# Patient Record
Sex: Female | Born: 1955 | Race: Black or African American | Hispanic: No | Marital: Married | Smoking: Former smoker
Health system: Southern US, Community
[De-identification: ages and names within clinical notes are randomized; demographics above are authoritative.]

## PROBLEM LIST (undated history)

## (undated) DIAGNOSIS — I82401 Acute embolism and thrombosis of unspecified deep veins of right lower extremity: Secondary | ICD-10-CM

## (undated) DIAGNOSIS — I10 Essential (primary) hypertension: Secondary | ICD-10-CM

## (undated) DIAGNOSIS — N390 Urinary tract infection, site not specified: Secondary | ICD-10-CM

## (undated) DIAGNOSIS — K8020 Calculus of gallbladder without cholecystitis without obstruction: Secondary | ICD-10-CM

## (undated) DIAGNOSIS — F411 Generalized anxiety disorder: Secondary | ICD-10-CM

## (undated) DIAGNOSIS — I2699 Other pulmonary embolism without acute cor pulmonale: Secondary | ICD-10-CM

## (undated) DIAGNOSIS — N200 Calculus of kidney: Secondary | ICD-10-CM

## (undated) DIAGNOSIS — T7840XA Allergy, unspecified, initial encounter: Secondary | ICD-10-CM

## (undated) DIAGNOSIS — J45909 Unspecified asthma, uncomplicated: Secondary | ICD-10-CM

## (undated) DIAGNOSIS — F329 Major depressive disorder, single episode, unspecified: Secondary | ICD-10-CM

## (undated) DIAGNOSIS — R51 Headache: Secondary | ICD-10-CM

## (undated) DIAGNOSIS — R519 Headache, unspecified: Secondary | ICD-10-CM

## (undated) DIAGNOSIS — E785 Hyperlipidemia, unspecified: Secondary | ICD-10-CM

## (undated) HISTORY — DX: Urinary tract infection, site not specified: N39.0

## (undated) HISTORY — PX: ABDOMINAL HYSTERECTOMY: SHX81

## (undated) HISTORY — DX: Calculus of kidney: N20.0

## (undated) HISTORY — DX: Headache: R51

## (undated) HISTORY — DX: Headache, unspecified: R51.9

## (undated) HISTORY — DX: Generalized anxiety disorder: F41.1

## (undated) HISTORY — DX: Allergy, unspecified, initial encounter: T78.40XA

## (undated) HISTORY — DX: Essential (primary) hypertension: I10

## (undated) HISTORY — PX: NASAL SINUS SURGERY: SHX719

## (undated) HISTORY — DX: Unspecified asthma, uncomplicated: J45.909

## (undated) HISTORY — DX: Major depressive disorder, single episode, unspecified: F32.9

## (undated) HISTORY — DX: Other pulmonary embolism without acute cor pulmonale: I26.99

## (undated) HISTORY — PX: COLONOSCOPY: SHX174

## (undated) HISTORY — PX: LAPAROSCOPIC ASSISTED VAGINAL HYSTERECTOMY: SHX5398

## (undated) HISTORY — DX: Acute embolism and thrombosis of unspecified deep veins of right lower extremity: I82.401

## (undated) HISTORY — DX: Calculus of gallbladder without cholecystitis without obstruction: K80.20

## (undated) HISTORY — DX: Hyperlipidemia, unspecified: E78.5

## (undated) HISTORY — PX: TUBAL LIGATION: SHX77

---

## 2003-12-12 ENCOUNTER — Other Ambulatory Visit: Admission: RE | Admit: 2003-12-12 | Discharge: 2003-12-12 | Payer: Self-pay | Admitting: Obstetrics & Gynecology

## 2004-03-14 ENCOUNTER — Ambulatory Visit: Payer: Self-pay | Admitting: Internal Medicine

## 2004-06-23 ENCOUNTER — Ambulatory Visit: Payer: Self-pay | Admitting: Internal Medicine

## 2005-08-03 ENCOUNTER — Emergency Department (HOSPITAL_COMMUNITY): Admission: EM | Admit: 2005-08-03 | Discharge: 2005-08-03 | Payer: Self-pay | Admitting: Emergency Medicine

## 2005-08-05 ENCOUNTER — Ambulatory Visit: Payer: Self-pay | Admitting: Internal Medicine

## 2006-05-17 ENCOUNTER — Ambulatory Visit: Payer: Self-pay | Admitting: Internal Medicine

## 2006-06-02 ENCOUNTER — Ambulatory Visit: Payer: Self-pay | Admitting: Gastroenterology

## 2006-06-08 ENCOUNTER — Ambulatory Visit: Payer: Self-pay | Admitting: Internal Medicine

## 2006-06-22 ENCOUNTER — Ambulatory Visit: Payer: Self-pay | Admitting: Gastroenterology

## 2006-06-25 ENCOUNTER — Inpatient Hospital Stay (HOSPITAL_COMMUNITY): Admission: EM | Admit: 2006-06-25 | Discharge: 2006-06-27 | Payer: Self-pay | Admitting: Emergency Medicine

## 2006-06-30 ENCOUNTER — Ambulatory Visit: Payer: Self-pay | Admitting: Internal Medicine

## 2006-06-30 ENCOUNTER — Ambulatory Visit: Payer: Self-pay | Admitting: Gastroenterology

## 2006-06-30 LAB — CONVERTED CEMR LAB
ALT: 21 units/L (ref 0–40)
AST: 20 units/L (ref 0–37)
Albumin: 3.8 g/dL (ref 3.5–5.2)
Alkaline Phosphatase: 93 units/L (ref 39–117)
Basophils Absolute: 0 10*3/uL (ref 0.0–0.1)
Basophils Relative: 0.2 % (ref 0.0–1.0)
CO2: 28 meq/L (ref 19–32)
Calcium: 8.6 mg/dL (ref 8.4–10.5)
Chloride: 111 meq/L (ref 96–112)
Creatinine, Ser: 0.8 mg/dL (ref 0.4–1.2)
GFR calc Af Amer: 97 mL/min
GFR calc non Af Amer: 80 mL/min
Glucose, Bld: 97 mg/dL (ref 70–99)
Lymphocytes Relative: 50.4 % — ABNORMAL HIGH (ref 12.0–46.0)
MCV: 96.5 fL (ref 78.0–100.0)
Monocytes Relative: 14.3 % — ABNORMAL HIGH (ref 3.0–11.0)
Neutro Abs: 1.2 10*3/uL — ABNORMAL LOW (ref 1.4–7.7)
Neutrophils Relative %: 34 % — ABNORMAL LOW (ref 43.0–77.0)
Potassium: 4.1 meq/L (ref 3.5–5.1)
Sodium: 143 meq/L (ref 135–145)

## 2006-07-01 ENCOUNTER — Ambulatory Visit (HOSPITAL_COMMUNITY): Admission: RE | Admit: 2006-07-01 | Discharge: 2006-07-01 | Payer: Self-pay | Admitting: Gastroenterology

## 2006-08-27 ENCOUNTER — Encounter (INDEPENDENT_AMBULATORY_CARE_PROVIDER_SITE_OTHER): Payer: Self-pay | Admitting: Obstetrics and Gynecology

## 2006-08-28 ENCOUNTER — Inpatient Hospital Stay (HOSPITAL_COMMUNITY): Admission: RE | Admit: 2006-08-28 | Discharge: 2006-08-29 | Payer: Self-pay | Admitting: Obstetrics and Gynecology

## 2006-11-08 DIAGNOSIS — K8010 Calculus of gallbladder with chronic cholecystitis without obstruction: Secondary | ICD-10-CM

## 2006-11-08 DIAGNOSIS — R519 Headache, unspecified: Secondary | ICD-10-CM | POA: Insufficient documentation

## 2006-11-08 DIAGNOSIS — K8020 Calculus of gallbladder without cholecystitis without obstruction: Secondary | ICD-10-CM

## 2006-11-08 DIAGNOSIS — R51 Headache: Secondary | ICD-10-CM | POA: Insufficient documentation

## 2006-11-08 DIAGNOSIS — L509 Urticaria, unspecified: Secondary | ICD-10-CM | POA: Insufficient documentation

## 2006-11-08 DIAGNOSIS — I10 Essential (primary) hypertension: Secondary | ICD-10-CM | POA: Insufficient documentation

## 2006-11-08 HISTORY — DX: Essential (primary) hypertension: I10

## 2006-11-08 HISTORY — DX: Calculus of gallbladder without cholecystitis without obstruction: K80.20

## 2006-11-08 HISTORY — DX: Calculus of gallbladder with chronic cholecystitis without obstruction: K80.10

## 2006-11-11 ENCOUNTER — Encounter: Payer: Self-pay | Admitting: Internal Medicine

## 2006-11-11 DIAGNOSIS — F411 Generalized anxiety disorder: Secondary | ICD-10-CM | POA: Insufficient documentation

## 2006-11-11 DIAGNOSIS — F32A Depression, unspecified: Secondary | ICD-10-CM | POA: Insufficient documentation

## 2006-11-11 DIAGNOSIS — F329 Major depressive disorder, single episode, unspecified: Secondary | ICD-10-CM

## 2006-11-11 DIAGNOSIS — F3289 Other specified depressive episodes: Secondary | ICD-10-CM

## 2006-11-11 HISTORY — DX: Other specified depressive episodes: F32.89

## 2006-11-11 HISTORY — DX: Generalized anxiety disorder: F41.1

## 2006-11-11 HISTORY — DX: Major depressive disorder, single episode, unspecified: F32.9

## 2007-10-28 IMAGING — CT CT ABDOMEN W/ CM
2 of 6 series · 17 of 46 positions shown, 19 images · IV contrast (Omnipaque 300)
Comparison: 06/24/06.

07/02/06 - DUPLICATE COPY for exam association in [facility] ? No change from original report.
CLINICAL DATA: Right lower quadrant pain. Severe abdominal pain since colonoscopy 06/22/06. Nausea and blood in stool.
 ABDOMEN CT WITH CONTRAST:
TECHNIQUE: Multidetector CT imaging of the abdomen was performed following the standard protocol during bolus administration of intravenous contrast.
 Contrast:  125 cc Omnipaque 300.
TECHNIQUE: Multidetector CT imaging of the pelvis was performed following the standard protocol during bolus administration of intravenous contrast.

[Series 2: abd_pel 5.0 b30f st · axial · 0.70mm/px · z∈[+938,+1298]mm · 14 of 80 slices shown, 16 images]
[im 4/80  soft-tissue]
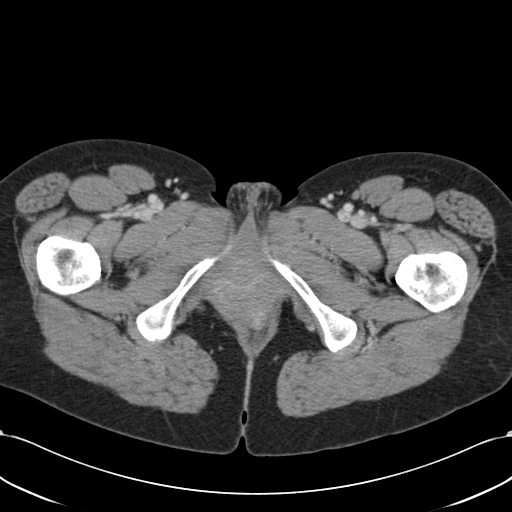
[im 4/80  bone]
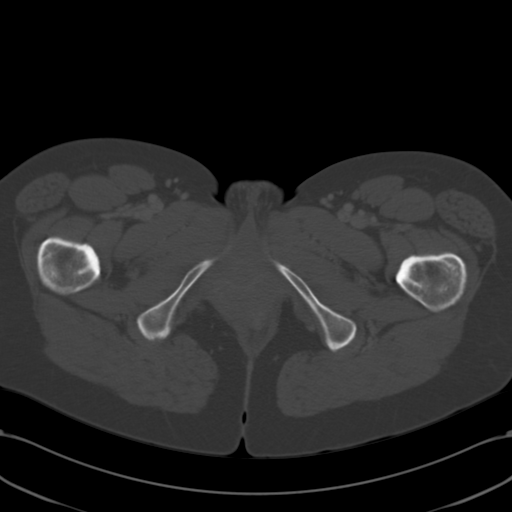
[im 12/80  soft-tissue]
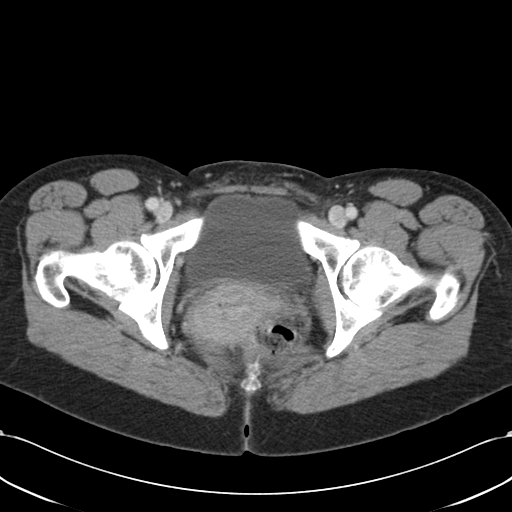
[im 16/80  soft-tissue]
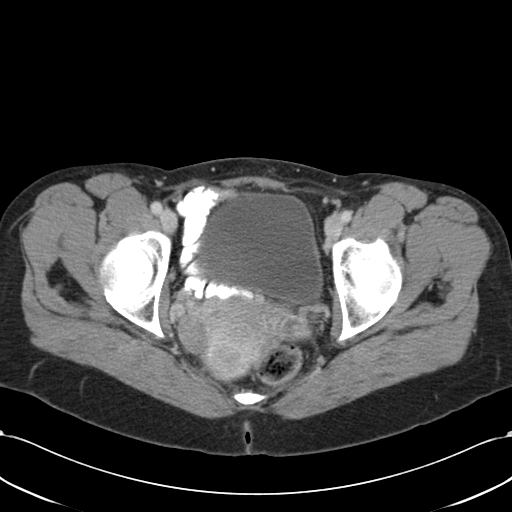
[im 20/80  soft-tissue]
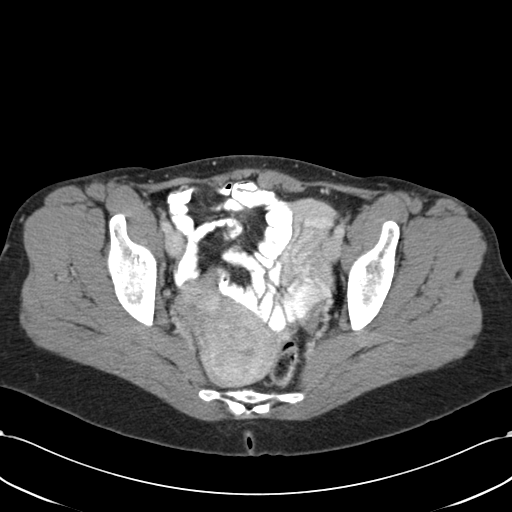
[im 28/80  soft-tissue]
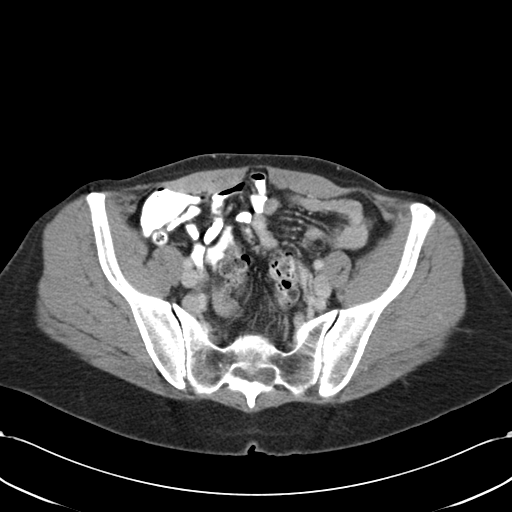
[im 32/80  soft-tissue]
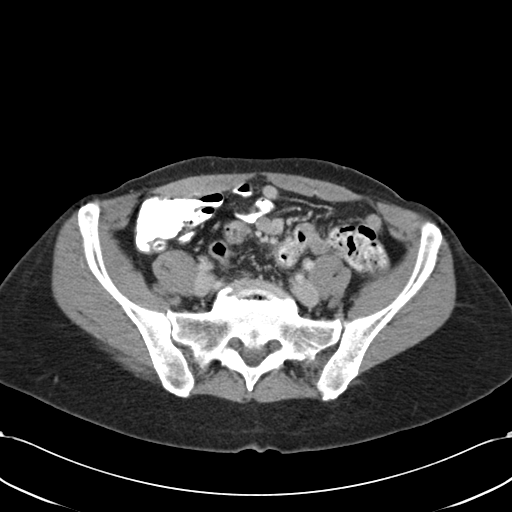
[im 36/80  soft-tissue]
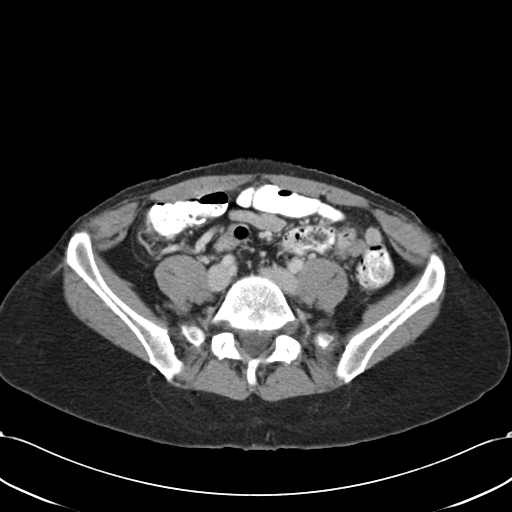
[im 44/80  soft-tissue]
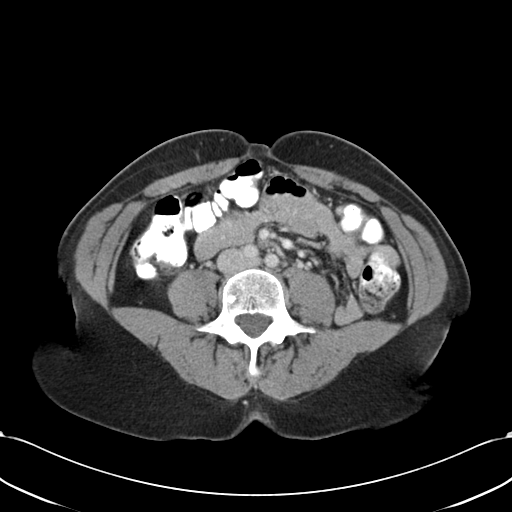
[im 48/80  soft-tissue]
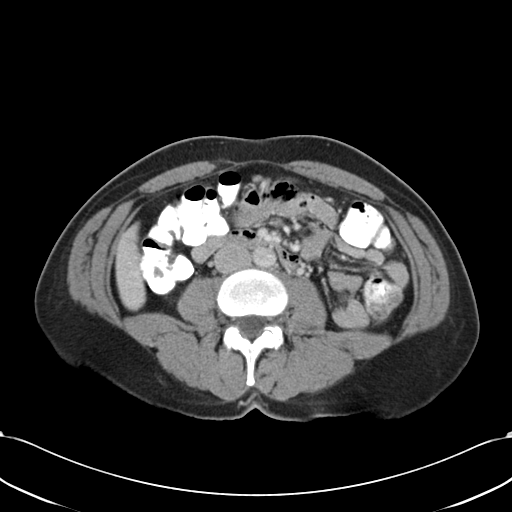
[im 48/80  bone]
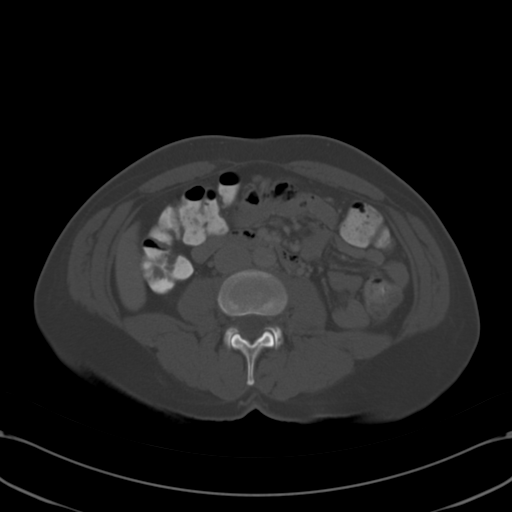
[im 52/80  soft-tissue]
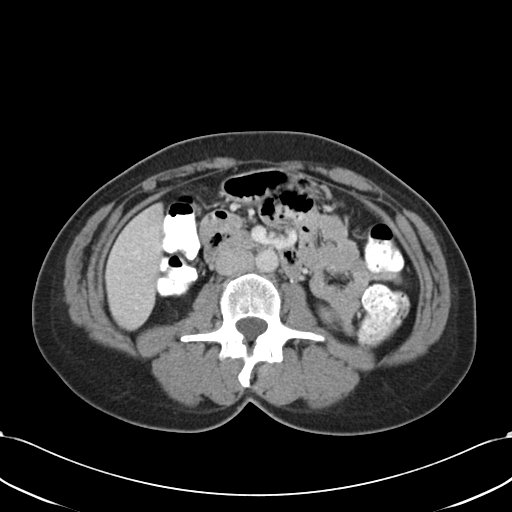
[im 60/80  soft-tissue]
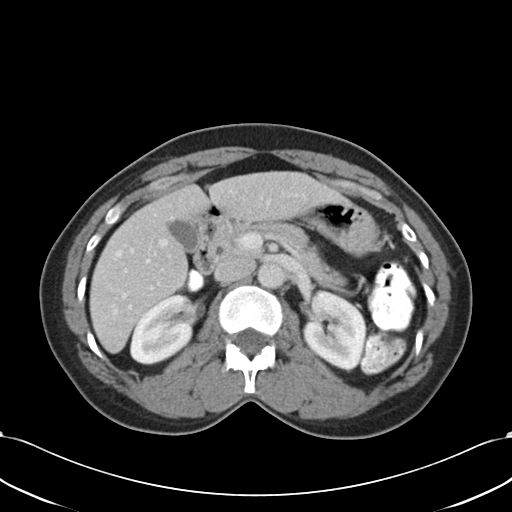
[im 64/80  soft-tissue]
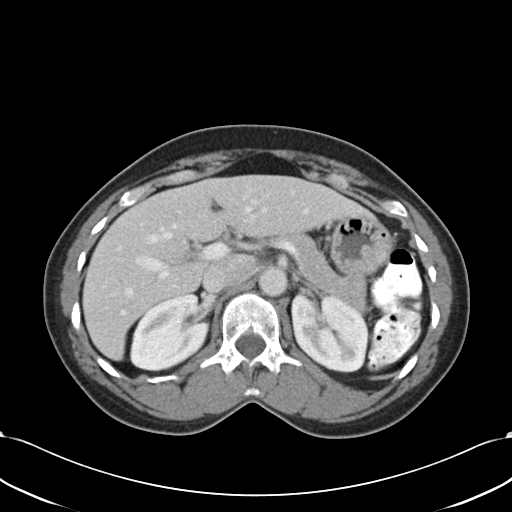
[im 68/80  soft-tissue]
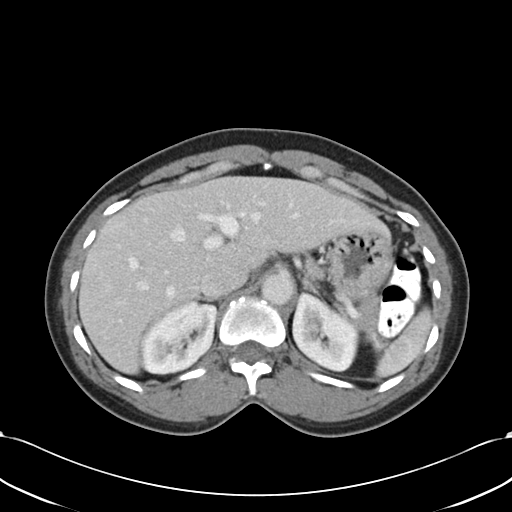
[im 76/80  soft-tissue]
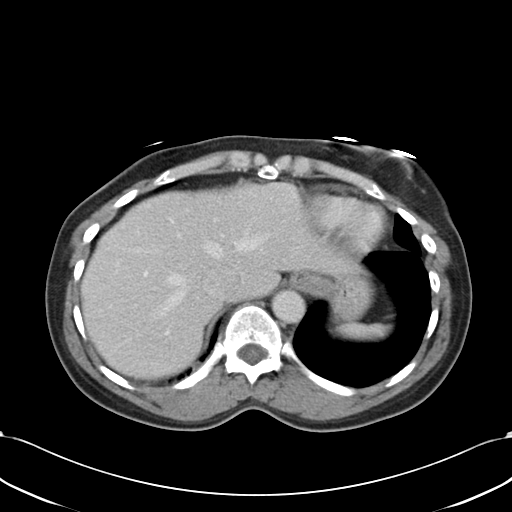

[Series 602: <mpr thick range> · coronal · 0.81mm/px · 3 of 84 slices shown]
[im 28/84  soft-tissue]
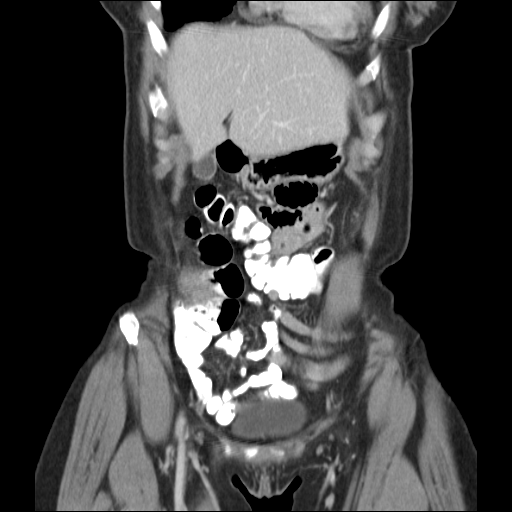
[im 37/84  soft-tissue]
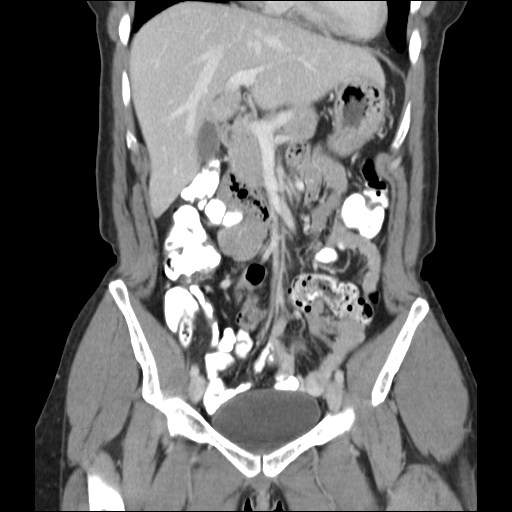
[im 47/84  soft-tissue]
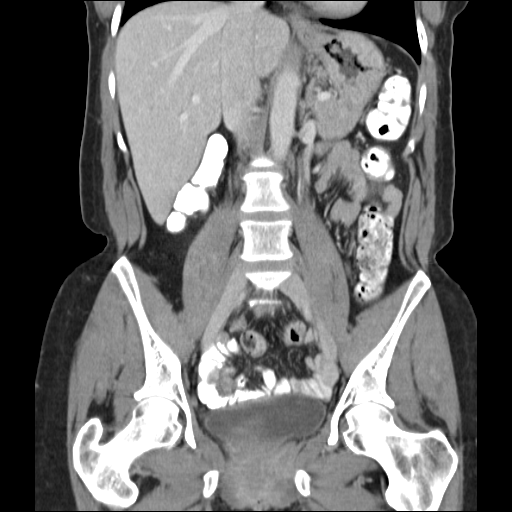

[17 of 46 positions shown; findings below may reference images not displayed]

FINDINGS: A small hemangioma is seen in the right hepatic lobe. The liver, gallbladder, adrenal glands, kidneys, spleen, pancreas, stomach, and small bowel are otherwise unremarkable. No pathologically enlarged lymph nodes. No free fluid.
IMPRESSION: No acute findings. 
 PELVIS CT WITH CONTRAST:
FINDINGS: Fibroid uterus. No pathologically enlarged lymph nodes.  No free fluid.  The colon and appendix are unremarkable. No worrisome lytic or sclerotic lesion.
IMPRESSION: Enlarged fibroid uterus.

## 2007-11-17 LAB — CONVERTED CEMR LAB: Pap Smear: NORMAL

## 2008-01-07 ENCOUNTER — Emergency Department (HOSPITAL_COMMUNITY): Admission: EM | Admit: 2008-01-07 | Discharge: 2008-01-08 | Payer: Self-pay | Admitting: Emergency Medicine

## 2008-01-09 ENCOUNTER — Encounter: Admission: RE | Admit: 2008-01-09 | Discharge: 2008-01-09 | Payer: Self-pay | Admitting: Orthopedic Surgery

## 2008-04-16 ENCOUNTER — Ambulatory Visit: Payer: Self-pay | Admitting: Internal Medicine

## 2008-04-16 DIAGNOSIS — G4710 Hypersomnia, unspecified: Secondary | ICD-10-CM | POA: Insufficient documentation

## 2008-04-16 LAB — CONVERTED CEMR LAB
AST: 27 units/L (ref 0–37)
BUN: 10 mg/dL (ref 6–23)
CO2: 27 meq/L (ref 19–32)
Calcium: 9.2 mg/dL (ref 8.4–10.5)
Creatinine, Ser: 0.7 mg/dL (ref 0.4–1.2)
Eosinophils Absolute: 0 10*3/uL (ref 0.0–0.7)
GFR calc Af Amer: 113 mL/min
Glucose, Bld: 94 mg/dL (ref 70–99)
HCT: 38.4 % (ref 36.0–46.0)
HDL: 52.2 mg/dL (ref >39.0)
Hemoglobin: 13.1 g/dL (ref 12.0–15.0)
Ketones, ur: NEGATIVE mg/dL
MCV: 95.1 fL (ref 78.0–100.0)
Monocytes Absolute: 0.2 10*3/uL (ref 0.1–1.0)
Monocytes Relative: 4.8 % (ref 3.0–12.0)
Neutro Abs: 2 10*3/uL (ref 1.4–7.7)
Nitrite: NEGATIVE
Platelets: 175 10*3/uL (ref 150–400)
Specific Gravity, Urine: <=1.005 (ref 1.000–1.035)
Total Protein, Urine: NEGATIVE mg/dL
Total Protein: 7.2 g/dL (ref 6.0–8.3)
Triglycerides: 63 mg/dL (ref 0–149)
Urobilinogen, UA: 0.2 (ref 0.0–1.0)
WBC: 4.5 10*3/uL (ref 4.5–10.5)

## 2008-05-02 ENCOUNTER — Ambulatory Visit: Payer: Self-pay | Admitting: Pulmonary Disease

## 2010-07-01 NOTE — Consult Note (Signed)
NAME:  Deborah Pratt, Deborah Pratt     ACCOUNT NO.:  [id number]   MEDICAL RECORD NO.:  [id number]          PATIENT TYPE:  OBV   LOCATION:  1614                         FACILITY:  [facility]   PHYSICIAN:  Velora Heckler, MD      DATE OF BIRTH:  04-28-1955   DATE OF CONSULTATION:  06/25/2006  DATE OF DISCHARGE:                                 CONSULTATION   GENERAL SURGERY CONSULTATION   REFERRING PHYSICIAN:  Mike Gip, PA-C  for [facility].   CHIEF COMPLAINT:  Abdominal pain.   HISTORY OF PRESENT ILLNESS:  Deborah Pratt is a 55 year old  black female admitted on May8, 2008, for abdominal pain following  colonoscopy.  The patient had undergone a routine screening colonoscopy  on May6, 2008, by Dr. Rob Bunting of [facility].  Procedure was uncomplicated.  Findings were normal exam.  No polypectomy  or biopsies were taken.  Approximately 36-48 hours following the  procedure, the patient developed suprapubic abdominal pain.  She  describes this as sharp in nature.  She describes it as radiating into  the chest.  She denies nausea or vomiting.  She denies fevers.  She has  not had a normal bowel movement since the procedure.  She denies  bleeding per rectum.  She is passing flatus.  The patient presented to  the emergency department.  She was admitted onto the gastroenterology  service.  Laboratory studies showed normal white blood count.  The  patient underwent CT scan abdomen and pelvis.  Findings included a large  amount of stool in the colon.  There appears to be some stool at the  base of the appendix with slight thickening of the base of the appendix.  There appears to be air in the appendiceal lumen.  There is no sign of  inflammation.  There is no sign of perforation.  General surgery was  consulted at this time for evaluation for possible subacute  appendicitis.   PAST MEDICAL HISTORY:  Status post tubal ligation.   MEDICATIONS:  Ibuprofen  p.r.n.   ALLERGIES:  1. ASPIRIN.  2. TYLENOL.   SOCIAL HISTORY:  The patient is married.  She lives in [place].  She  smokes cigarettes.  She denies alcohol use.   FAMILY HISTORY:  Noncontributory.   REVIEW OF SYSTEMS:  A 15-system review documented in medical record  without significant other findings except for asthma and  nephrolithiasis.   PHYSICAL EXAMINATION:  GENERAL:  A 55 year old well-developed, well-  nourished black female in no acute distress.  No complaints.  VITAL SIGNS:  Temperature 97.5, pulse 68, respirations 18, blood  pressure 95/50, O2 saturation 99% room air.  HEENT:  Shows her be normocephalic, atraumatic.  There is a conjunctival  hemorrhage in the left eye.  NECK:  Soft, nontender without mass or adenopathy.  CHEST:  Clear to auscultation bilaterally.  CARDIAC:  Shows regular rate and rhythm without murmur.  Peripheral  pulses are full.  ABDOMEN:  Soft, scaphoid.  Bowel sounds are present on auscultation.  The patient notes passage of flatus.  There is mild tenderness to  percussion and palpation in the suprapubic region.  The  remainder of the  abdomen is soft.  There is no guarding.  There is no rebound.  Specifically, in the right lower quadrant there is a relatively normal  exam without mass.  There is no sign of hernia.  EXTREMITIES:  Show no  edema.  NEUROLOGICALLY:  The patient is alert and oriented.  There is no  evidence of tremor.  There are no gross neurologic deficits.   LABORATORY STUDIES:  White count 3.9, hemoglobin 10.5, platelet count  171,000.  Differential shows an abnormally elevated lymphocyte count of  61% and a low segmented neutrophil count of 24%.  Electrolytes were  normal.  Urinalysis is benign.   RADIOGRAPHY:  CT scan, abdomen and pelvis, reviewed with Dr. Irish Lack, with findings as noted above.   IMPRESSION:  Abdominal pain of undetermined source following  colonoscopy.   RECOMMENDATIONS:  1. No evidence of  acute appendicitis.  2. No evidence of perforation or cecal tear.  3. Chronic constipation.  4. Allow clear liquid diet.  5. Repeat CBC with differential in a.m. May10, 2008, to follow up      on lymphocytosis.  6. Will follow closely with you.   I discussed with the patient all of the above findings.  I explained to  her that she did not require surgical intervention at this time and  hopefully would not require an operation during this hospitalization.  I  did caution her that we would recheck her laboratory work and reexamine  her abdomen.  We will allow her to begin taking clear liquids.      Velora Heckler, MD  Electronically Signed     TMG/MEDQ  D:  06/25/2006  T:  06/25/2006  Job:  [id number]   cc:   Mike Gip, PA-C  [address] [place], [place] [address]   Rachael Fee, MD  [address]  [place], [place] [address]

## 2010-07-01 NOTE — H&P (Signed)
NAME:  Deborah Pratt, Deborah Pratt     ACCOUNT NO.:  [id number]   MEDICAL RECORD NO.:  [id number]          PATIENT TYPE:  OBV   LOCATION:  1614                         FACILITY:  [facility]   PHYSICIAN:  Deborah Morton. Juanda Chance, MD     DATE OF BIRTH:  1955/05/15   DATE OF ADMISSION:  06/24/2006  DATE OF DISCHARGE:                              HISTORY & PHYSICAL   CHIEF COMPLAINT:  Acute abdominal pain.   HISTORY:  Deborah Pratt is a pleasant, generally healthy, African American  female who presents with a complaint of abdominal pain. The patient is  known to Dr. Artist Pratt.  She had undergone screening colonoscopy with Dr.  Christella Pratt, done on Jun 22, 2006, at the [facility].  This was a normal exam.  She  did not have any polyps nor any diverticuli noted.  The patient  tolerated the procedure well and says that she did not have any  abdominal pain the evening after her colonoscopy.  The following day she  felt okay until the evening of Jun 23, 2006, when she began with left  lower quadrant discomfort.  She said she had been able to eat without  much difficulty, did not have any nausea, vomiting, or fever or chills,  had gone to bed, and was able to sleep.  However, on awakening the pain  was still present.  She went on to work and later in the day went to the  bathroom to urinate and says that she passed a huge amount of urine and  then experienced fairly severe crampy lower abdominal pain which doubled  her over.  This has persisted with pain across her lower abdomen.  She  denies any dysuria, hematuria, etc.  She had a normal bowel movement  earlier in the day.  Because of the persistent pain and pain with  walking, she presents to the emergency room.  She is found to be hemodynamically stable, multi abs show no evidence of  free air.  Labs with a WBC of 3.9, hemoglobin 11.6, hematocrit of 34.2,  potassium 3.3.  Liver function studies normal.  UA is pending.  She is  admitted for observation for pain control and further  diagnostic workup  with CT of the abdomen and pelvis.   PAST HISTORY:  Pertinent for:  1. Asthma.  2. Bilateral tubal ligation.  3. Ureterolithiasis.   FAMILY HISTORY:  Negative for GI   SOCIAL HISTORY:  The patient is a smoker.  No EtOH.  She is married.   REVIEW OF SYSTEMS:  CARDIOVASCULAR:  Denies any chest pain or anginal  symptoms.  PULMONARY:  Negative for cough, shortness of breath, or  sputum production.  GENITOURINARY:  As above.  She does have a history  of ureterolithiasis 30 years ago that has not recurred since.  MUSCULOSKELETAL:  Negative.  GI:  As outlined above.  All other review  of systems negative.   PHYSICAL EXAMINATION:  GENERAL:  A well-developed African American  female in no acute distress.  Alert and oriented x3 97.  VITAL SIGNS:  Temperature 97.5, BP 141/90, pulse of 88, respirations 18.  HEENT:  Nontraumatic, normocephalic.  EOMI, PERRLA.  Sclerae anicteric.  NECK:  Supple without nodes.  CARDIOVASCULAR:  Regular rate and rhythm with S1 and S2.  No murmur or  gallop.  PULMONARY:  Clear to A&P.  ABDOMEN:  Soft.  Bowel sounds are active.  She is tender in the right,  greater than left, lower quadrant and has rebound in no bilateral lower  quadrants.  There is no guarding.  No mass or hepatosplenomegaly.  RECTAL:  Not done.  EXTREMITIES:  Without clubbing, cyanosis or edema.  NEUROLOGIC:  Grossly nonfocal.   IMPRESSION:  A 55 year old African American female with acute severe  abdominal pain, lower abdomen, 48 hours post colonoscopy.  No evidence  for perforation on plain films.  Rule out micro perforation versus  spasm, rule ureterolithiasis, rule out ovarian cyst rupture,  rule out  possible appendicitis.   PLAN:  Patient is admitted to observation for pain control, antiemetics,  UA and urine culture, CT of the abdomen and pelvis, and further plans  pending findings on CT.      Deborah Madrid, PA-C      Deborah M. Juanda Chance, MD  Electronically  Signed    AE/MEDQ  D:  06/25/2006  T:  06/25/2006  Job:  [id number]

## 2010-07-01 NOTE — Assessment & Plan Note (Signed)
[facility]                                 ON-CALL NOTE   NAME:WHITNEY-TAYLORJaynee, Deborah Pratt              MRN:          [id number]  DATE:06/23/2006                            DOB:          26-Aug-1955    TELEPHONE NUMBER:  [phone]   CALLER:  Patient.   TIME OF CALL:  8:19pm.   [name] called this evening complaining of lower abdominal  tightness that developed this afternoon. She reports to me that she was  referred to Dr. Christella Hartigan for colonoscopy because of problems with  constipation. She tells me that the examination was negative with no  intervention such as polypectomy. She tells me that she did well last  evening and again was well this morning. She tells me that she was  contacted by her endoscopy nurse for routine follow up and had no  complaints. She has been eating. There is no nausea, vomiting, or  fevers. She has had a small bowel movement. No bleeding. She denies  urinary complaints. She denies having had this type of tightness before.  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 I told her that I thought it would be  unlikely that she would have done well for a day post procedure than  develop problems related to the procedure. However, I told her that it  was certainly possible. As well it, could be some other unrelated  process. I told her that I could not diagnose or treat her problem over  the telephone. As such, since she described as significant discomfort, I  recommended that she go to the emergency room for evaluation. She was  agreeable.     Wilhemina Bonito. Marina Goodell, MD  Electronically Signed    JNP/MedQ  DD: 06/23/2006  DT: 06/24/2006  Job #: [id number]   cc:   Rachael Fee, MD

## 2010-07-01 NOTE — H&P (Signed)
Deborah Pratt, Deborah Pratt     ACCOUNT NO.:  [id number]   MEDICAL RECORD NO.:  [id number]          PATIENT TYPE:  AMB   LOCATION:  SDC                           FACILITY:  [facility]   PHYSICIAN:  Janine Limbo, M.D.DATE OF BIRTH:  02-23-1955   DATE OF ADMISSION:  08/27/2006  DATE OF DISCHARGE:                              HISTORY & PHYSICAL   HISTORY OF PRESENT ILLNESS:  The patient is a 55 year old female, para  II-0-I-II, who presents for laparoscopy assisted vaginal hysterectomy.  The patient has been followed at the [facility] and  [facility] of [facility].  The patient  complains of pelvic pain, fibroids, menorrhagia, and dyspareunia.  Evaluation has included an ultrasound which showed an 8.8- x 6.5- x for  4.8-cm uterus.  Several fibroids were noted with the largest measuring  5.5 cm x 2.9 cm x 2.1 cm.  The ovaries appeared normal except for a  dominant follicle which measured 1.2 cm.  The patient reports that the  majority of her discomfort began in May 2008.  The patient had a  colonoscopy on Jun 22, 2006.  The findings were normal.  However, shortly  thereafter she began having severe abdominal pain and was admitted to  the hospital for observation and for pain management.  A CT scan was  performed, and it was thought to be within normal limits except for the  fibroids noted above.  Multiple laboratory tests were performed that  were all normal.  Cultures of her cervix were noted to be normal.  The  patient has seen a urologist, and he could find no urological basis for  her discomfort.  He did think that she perhaps had bladder spasms, and  this was slightly improved using Vesicare.  She complains of urinary  urgency and urinary frequency.  She also complains of constipation and  occasional nausea.  She reports having severe dyspareunia.  The patient  reports that her discomfort is worse when she ambulates.  Pain  medications  do not relieve the discomfort, they just make her sleepy.  The patient has been treated with antibiotics, and her discomfort has  not completely resolved.  She did have an endometrial biopsy performed  that showed benign endometrial tissue.  The patient has had a tubal  ligation dating back to 17.  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   OBSTETRICAL HISTORY:  The patient has had two term vaginal deliveries  and one elective pregnancy termination.   DRUG ALLERGIES:  The patient's drug allergy history is very confusing.  SHE REPORTS THAT IBUPROFEN, TYLENOL, AND ASPIRIN CAUSE HER TO HAVE  HIVES.  SHE REPORTS THAT MORPHINE CAUSES HER TO HAVE A RASH.  She says  that she has been able to take Demerol in the past, and that relieves  her discomfort.  She also reports that she is able to take Darvocet, and  that causes no problems for her, and it relieves her discomfort.  I did  point out that Darvocet has acetaminophen in it.  THE PATIENT REPORTS  HAVING MULTIPLE FOOD  ALLERGIES.   PAST MEDICAL HISTORY:  The patient has told some health care providers  that she had a history of asthma and a history of kidney stones.  Other  health care providers she does not report that history.   CURRENT MEDICATIONS:  Pain medicines and antibiotics (which she will  complete prior to her surgery).   SOCIAL HISTORY:  The patient stopped smoking two years ago.  She denies  alcohol use and recreational drug use.  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   REVIEW OF SYSTEMS:  Please see history of present illness.   FAMILY HISTORY:  The patient's mother has heart disease.  Her father had  a stroke.   PHYSICAL EXAM:  VITAL SIGNS:  Weight is 163 pounds.  Height is 5 feet 9  inches.  HEENT:  Within normal limits except for a patient that appears  uncomfortable as you discuss her problems.  CHEST:   Clear.  HEART:  Regular rate and rhythm.  BREASTS:  Without masses.  BACK:  Shows no CVA tenderness; though, she is noted to have tight back  muscles.  ABDOMEN:  Tender in the lower quadrants; although, her bowel sounds are  normal.  There is voluntary guarding but no rebound.  EXTREMITIES:  Grossly normal.  NEUROLOGIC:  Grossly normal.  PELVIC:  External genitalia is normal.  The vagina appears normal;  although, the bladder does seem to be tender on exam.  The cervix is  tender to motion.  The uterus is eight weeks size and tender.  Adnexa:  No masses are appreciated.  Rectovaginal exam confirms.   ASSESSMENT:  1. Fibroid uterus.  2. Menorrhagia.  3. Dyspareunia.  4. Pelvic pain.  5. Questionable bladder spasms.  6. Constipation.  7. Multiple allergies to medications.   PLAN:  The patient will undergo a laparoscopy assisted vaginal  hysterectomy.  She understands the indications for her surgical  procedure, and she accepts the risks of, but not limited to, anesthetic  complications, bleeding, infection, and possible damage to the  surrounding organs.  She understands that no guarantees can be given  concerning the relief of her discomfort.      Janine Limbo, M.D.  Electronically Signed     AVS/MEDQ  D:  08/20/2006  T:  08/20/2006  Job:  [id number]   cc:   Corwin Levins, MD  [address]  [place]  [place] [address]   Rachael Fee, MD  [address]  [place], [place] [address]   Hedwig Morton. Juanda Chance, MD  [address]  [place]  [place] [address]   Bertram Millard. Dahlstedt, M.D.  Fax: [phone]

## 2010-07-01 NOTE — Assessment & Plan Note (Signed)
[facility]                         GASTROENTEROLOGY OFFICE NOTE   NAME:WHITNEY-TAYLOR, ANITHA KREISER            MRN:          [id number]  DATE:06/30/2006                            DOB:          11-Aug-1955    GI FOLLOWUP NOTE   GI PROBLEM LIST:  Localized right lower quadrant pain since colonoscopy  Jun 22, 2006.  Colonoscopy was uneventful.  No polyps were removed.  Pain  began approximately 24 hours after the procedure.  CT scan showed some  air and debris and a somewhat dilated appendix, but no sign of  inflammation or perforation.  Dr. Gerrit Friends from [facility] consulted while the  patient was hospitalized for 2 to 3 days.  She was sent home this past  weekend.   INTERVAL HISTORY:  Ms. Ladona Ridgel is still quite uncomfortable in her right  lower quadrant.  She said it has not gotten better and possibly has  gotten a bit worse.  She has had no fevers, but has had some chills at  home.  She says the pain is constant and is not related to voiding her  bladder now.   CURRENT MEDICATIONS:  Allegra.  Zyrtec.  Xifaxan.   PHYSICAL EXAM:  Afebrile.  Weight 157 pounds, blood pressure 130/82,  pulse 60.  CONSTITUTIONAL:  Uncomfortable-appearing.  LUNGS:  Clear to auscultation bilaterally.  HEART:  Regular rate and rhythm.  ABDOMEN:  Soft, nondistended.  Focally tender in the right lower  quadrant with rebound tenderness, positive Rovsing sign.  No diffuse  peritoneal signs though.   ASSESSMENT AND PLAN:  A 55 year old woman with pain, abnormal appendix  following routine screening colonoscopy.   Ms. Ladona Ridgel has been having increasing right lower quadrant discomfort  since an uneventful colonoscopy last week.  CT scan did show an abnormal  appendix, but no sign of inflammation.  I am suspicious that she is  developing appendicitis.  I will arrange for her to have a repeat CT  scan done today, as well as blood work.  I will contact Dr. Gerrit Friends, who  met her while she  was hospitalized, and discuss the case with him.  I  think, unfortunately, that even if the scan is normal with her ongoing  focal right lower quadrant pain, she may need laparoscopy.     Rachael Fee, MD  Electronically Signed    DPJ/MedQ  DD: 06/30/2006  DT: 06/30/2006  Job #: [id number]   cc:   Velora Heckler, MD  Barbette Hair. Artist Pais, DO

## 2010-07-01 NOTE — Assessment & Plan Note (Signed)
[facility]                                 ON-CALL NOTE   NAME:Deborah Pratt, [place]              MRN:          [id number]  DATE:06/30/2006                            DOB:          1956/01/01    TELEPHONE NUMBER:  [phone].   PRIMARY CARE PHYSICIAN:  Rob Bunting, M.D.   TIME:  8:10 p.m.   CALL NOTE:  Deborah Pratt calls tonight, wanting the results of a CT  scan that was performed today and was very irritated, frustrated, and  irate, that she had not received a phone call back Dr. Christella Hartigan on the  report of the CT scan. I conveyed to her that on a normal or a minimally  abnormal scan, we will not get a report until the next day she became  even more irritated. I tried to obtain more history and apparently, she  had been hospitalized a few days ago for lower abdominal pain following  a colonoscopy and a CT scan was performed in the hospital as well as  blood work that was not revealing as to a cause of her pain. She is  still having what she describes as pelvic pain without vomiting,  fevers, chills, or gastrointestinal bleeding. I told her that Dr. Christella Hartigan  or his nurse should be in touch with her tomorrow on the results of her  CT scan and if she did not receive a call by the midafternoon she should  call the office. If she felt worse tonight, she was advised go to the  emergency room. After I attempted to get more history from her and  reassure her, an man who was with her took the phone and began yelling  at me that we had not gotten results to her appropriately and that I  just needed to pass on a message that she wanted the CT report and she  did not need to give me any more information about her situation. He was  loud, inappropriate, aggressive, and then he began using profanity, at  which time I told him that I was unable to speak to him anymore and was  hanging up the phone.     Venita Lick. Russella Dar, MD, [facility]  Electronically  Signed    MTS/MedQ  DD: 06/30/2006  DT: 06/30/2006  Job #: [id number]   cc:   Rachael Fee, MD

## 2010-07-01 NOTE — Op Note (Signed)
NAMEANAHLA, BEVIS     ACCOUNT NO.:  [id number]   MEDICAL RECORD NO.:  [id number]          PATIENT TYPE:  OIB   LOCATION:  9311                          FACILITY:  [facility]   PHYSICIAN:  Janine Limbo, M.D.DATE OF BIRTH:  August 26, 1955   DATE OF PROCEDURE:  08/27/2006  DATE OF DISCHARGE:                               OPERATIVE REPORT   PREOPERATIVE DIAGNOSIS:  1. Fibroid uterus.  2. Pelvic pain.  3. Dysmenorrhea.  4. Dyspareunia.   POSTOPERATIVE DIAGNOSIS:  1. Fibroid uterus.  2. Pelvic pain.  3. Dysmenorrhea.  4. Dyspareunia.  5. Pelvic adhesions.   PROCEDURE:  Laparoscopic assisted vaginal hysterectomy.   SURGEON:  Janine Limbo, M.D.   FIRST ASSISTANT:  Hal Morales, M.D.   ANESTHESIA:  General.   DISPOSITION:  Ms. Pollio is a 55 year old female, para 2-0-1-2,  who presents with the above mentioned diagnosis.  She understands the  indications for the surgical procedure and she accepts the risks of, but  not limited to, anesthetic complications, bleeding, infection, and  possible damage to surrounding organs.  She understands that no  guarantees can be given concerning the total relief of her discomfort.   FINDINGS:  The uterus was 10 weeks' size and it contained multiple  subserosal fibroids.  The right ovary was normal.  The left ovary was  normal except it did contain a 1-2 cm simple cyst.  The fallopian tubes  were normal except for defects from her prior tubal ligation.  There  were mild to moderate adhesions between the right fallopian tube and the  right posterior uterus.  The appendix was shortened and it was scarred  to the bowel.  The anterior cul-de-sac was normal.  The posterior cul-de-  sac was normal.  The upper abdomen appeared normal.   PROCEDURE IN DETAIL:  The patient was taken to the operating room where  a general anesthetic was given.  The patient's abdomen, perineum, and  vagina were prepped with multiple layers of  Hibiclens.  Examination  under anesthesia was performed.  A catheter was placed in the bladder.  A Hulka tenaculum was placed inside the uterus.  The patient was then  sterilely draped.  The subumbilical area was injected with 5 mL of 0.5%  Marcaine with epinephrine.  An incision was made and the Veress needle  was inserted without difficulty.  Proper placement was confirmed using  the saline drop test.  A pneumoperitoneum was then obtained.  The  laparoscopic trocar and then the laparoscope were substituted for the  Veress needle.  Care was taken not to damage the bowel.  The pelvis was  carefully inspected.  Two suprapubic areas were injected with a total of  5 mL of 0.5% Marcaine with epinephrine and two 5 mm trocars were placed  in the lower abdomen under direct visualization.  Pictures were taken of  the patient's pelvic anatomy.   The ureters were identified and were noted to be away from our surgical  areas.  The right round ligament was identified, cauterized, and cut.  The right utero-ovarian ligament and right fallopian tube were  cauterized and cut.  The uterine artery  was skeletonized on the right.  The bladder flap was developed using sharp dissection and  hydrodissection.  An identical procedure was carried out on the opposite  side.   At this point, we felt that we were ready to proceed with the vaginal  portion of the procedure.  The patient was placed in a more lithotomy  position.  A weighted speculum was placed in the posterior vagina.  The  cervix was injected with a diluted solution of Pitressin and saline.  A  circumferential incision was made around the cervix and the vaginal  mucosa was advanced anteriorly and posteriorly.  The anterior cul-de-sac  was entered.  The posterior part cul-de-sac was sharply entered.  Alternating from right to left, the uterosacral ligaments, paracervical  tissues, parametrial tissues, and uterine arteries were clamped, cut,   sutured, and tied securely.  The uterus was removed from the operative  field.  Hemostasis was noted to be adequate throughout.  The sutures  attached to the uterosacral ligaments were brought out through the  vaginal angles and tied securely.  A McCall culdoplasty suture was  placed in the posterior cul-de-sac incorporating the uterosacral  ligaments bilaterally and the posterior peritoneum.  A final check was  made for hemostasis and again hemostasis was confirmed.  The vaginal  cuff was closed using figure-of-eight sutures.   The operator then changed gown and gloves.  The pneumoperitoneum was  reestablished.  The laparoscope was again inserted and the pelvis was  carefully inspected.  The pelvis was irrigated.  Hemostasis was noted to  be adequate throughout.  The bowel was carefully inspected and there was  no evidence of damage to the bowel or any other pelvic organs.  At this  point, we felt we were ready to terminate our procedure.  The  pneumoperitoneum was allowed to escape.  The suprapubic trocars were  removed under direct visualization.  The subumbilical trocar was  removed.  The fascia of the subumbilical incision was closed using  figure-of-eight sutures of 0 Vicryl.  The skin of all incisions were  closed using 3-0 Monocryl.  Sponge, needle, and instrument counts were  correct on two occasions.  The estimated blood loss for the procedure was 100 mL.  The patient  tolerated her procedure well.  She was awakened from her anesthetic  without difficulty and taken to the recovery room in stable condition.  She did tolerate the procedure well.  The uterus weighed approximately  190 grams and was sent to pathology for evaluation.      Janine Limbo, M.D.  Electronically Signed     AVS/MEDQ  D:  08/27/2006  T:  08/28/2006  Job:  [id number]   cc:   Corwin Levins, MD  [address]  [place]  [place] [address]   Rachael Fee, MD  [address]  [place], [place]  [address]   Hedwig Morton. Juanda Chance, MD  [address]  [place]  [place] [address]   Bertram Millard. Dahlstedt, M.D.  Fax: [phone]

## 2010-07-04 NOTE — Discharge Summary (Signed)
Deborah Pratt, Deborah Pratt     ACCOUNT NO.:  [id number]   MEDICAL RECORD NO.:  [id number]          PATIENT TYPE:  INP   LOCATION:  9311                          FACILITY:  [facility]   PHYSICIAN:  Janine Limbo, M.D.DATE OF BIRTH:  1955-11-05   DATE OF ADMISSION:  08/27/2006  DATE OF DISCHARGE:  08/29/2006                               DISCHARGE SUMMARY   DISCHARGE DIAGNOSES:  1. Symptomatic uterine fibroids.  2. Pelvic pain.  3. Dysmenorrhea.  4. Dyspareunia.  5. Pelvic adhesions.  6. Adenomyosis.   OPERATION:  On the day of admission the patient underwent a  laparoscopically-assisted vaginal hysterectomy tolerating procedures  well.   HISTORY OF PRESENT ILLNESS:  The patient is a 55 year old female para 2-  0-1-2 who presents for a laparoscopically-assisted vaginal hysterectomy  because of symptomatic uterine fibroids.  Please see the patient's  dictated history and physical examination for details.   PREOPERATIVE PHYSICAL EXAM:  VITAL SIGNS:  Weight is 163 pounds, height  is 5 feet 9 inches.  GENERAL:  Within normal limits.  ABDOMEN:  Tender in the lower quadrants although the patient's bowel  sounds are normal.  There is also voluntary guarding but no rebound.  PELVIC:  External genitalia was normal. Vagina appeared normal although  the bladder and did seem to be tender on exam. The cervix was tender to  motion.  The uterus was 8 weeks size and tender.  Adnexa was without  masses and rectovaginal exam confirms.   HOSPITAL COURSE:  On the day of admission the patient underwent  aforementioned procedure tolerating it well.  Postoperative course was  marked by the patient having some difficulty in achieving pain control  though she tolerated her postoperative hemoglobin of 9.9 (preoperative  hemoglobin 12.6) without symptoms.  By postoperative day #2 the patient  had achieved good pain control, was tolerating a regular diet, and had  resumed bowel and bladder function,  therefore was deemed ready for  discharge home.   DISCHARGE MEDICATIONS:  Were not listed on the patient's discharge  papers however, she was to call [facility] at [phone] to  schedule a 6 weeks postoperative visit with Dr. Stefano Gaul.   DISCHARGE INSTRUCTIONS:  The patient was given a copy of [facility] postoperative instruction sheet.  She was further  advised to avoid driving for 1-2 weeks, heavy lifting for 4 weeks,  intercourse for 6 weeks, that she may walk up steps, may shower, should  increase her activities slowly.  The patient's diet was without  restriction.   FINAL PATHOLOGY:  Uterus and cervix:  Cervix - unremarkable; endometrium  - inactive, no evidence of hyperplasia or malignancy; myometrium -  adenomyosis; and leiomyomata submucosal, intramural and subserosal.      Elmira J. Adline Peals.      Janine Limbo, M.D.  Electronically Signed    EJP/MEDQ  D:  09/24/2006  T:  09/26/2006  Job:  [id number]

## 2010-07-04 NOTE — Assessment & Plan Note (Signed)
[facility]                                 ON-CALL NOTE   NAME:Pratt, Deborah LASKY            MRN:          [id number]  DATE:07/06/2006                            DOB:          02/07/1956    I just got off the phone with Dr. Ambrose Mantle who is an obstetrician whom I  referred [name] to for evaluation of her abnormal uterus  seen on 2 CT scans. Mr. Ambrose Mantle had a bad encounter with the patient and  more specifically with her husband in his office this afternoon. Dr.  Ambrose Mantle described that the patient seemed very pleasant and he was about  to begin to exam her but the husband showed signs of very aggressive  hostile behavior. Dr. Ambrose Mantle offered them the chance not to establish  care with him. They took him up on that offer. Dr. Ambrose Mantle explained to  me that he was so taken aback by the patient's husbands words and  behavior that he has alerted his staff to call the police if he ever  returns. My office will try again to arrange for her to have a  gynecological evaluation but I have instructed my staff as well to not  allow her husband into the office. Dr. Russella Dar also had a very poor  encounter with this patient's husband on the phone late last week that  is well documented in a previous on call note.     Rachael Fee, MD  Electronically Signed    DPJ/MedQ  DD: 07/06/2006  DT: 07/06/2006  Job #: [id number]   cc:   Malachi Pro. Ambrose Mantle, M.D.  Venita Lick. Russella Dar, MD, [facility]  Velora Heckler, MD

## 2010-07-04 NOTE — Discharge Summary (Signed)
Pratt, Deborah     ACCOUNT NO.:  [id number]   MEDICAL RECORD NO.:  [id number]          PATIENT TYPE:  INP   LOCATION:  1614                         FACILITY:  [facility]   PHYSICIAN:  Barbette Hair. Arlyce Dice, MD,FACGDATE OF BIRTH:  1955-04-30   DATE OF ADMISSION:  06/24/2006  DATE OF DISCHARGE:  06/27/2006                               DISCHARGE SUMMARY   ADMITTING DIAGNOSES:  20. A 55 year old female with severe lower abdominal pain 48 hours post      colonoscopy with no evidence for perforation on plain films.  Rule      out micro perforation, spasm, or other intraabdominal pathology,      i.e., ureterolithiasis, ovarian cyst rupture, or possible      appendicitis.  2. Status post bilateral tubal ligation.  3. History of asthma.  4. History of ureterolithiasis.   DISCHARGE DIAGNOSIS:  A 55 year old female with acute abdominal pain  occurring 48 hours post colonoscopy, improved.  Etiology not clear with  no evidence for perforation or appendicitis.   CONSULTATIONS:  Dr. Gerrit Friends, Surgery.   PROCEDURES:  CT scan of the abdomen and pelvis.   BRIEF HISTORY:  Deborah Pratt is a pleasant, generally healthy, African-  American female presenting with complaint of abdominal pain.  She is a  primary patient of Dr. Artist Pais and had undergone screening colonoscopy with  Dr. Christella Hartigan on Jun 22, 2006, at the [facility].  This was a normal exam without  any evidence for polyps or diverticula.  The patient tolerated the  procedure well and says she did not have any abdominal pain the evening  after her procedure.  The following day she felt okay until later in the  evening on May 7 when she began having left lower quadrant discomfort.  She says she was able to sleep without much difficulty and did not have  any associated nausea, vomiting, fever, or chills.  However, on  awakening the following morning, the pain was still present.  She went  on to work and then later in the day had gone to the bathroom to  urinate  and says she passed a huge amount of urine and then experienced fairly  severe crampy lower abdominal pain which doubled her over.  This has  persisted with pain across the lower abdomen.  She denies any dysuria,  hematuria, etc., and had a normal bowel movement.  Because of this  fairly severe pain and abrupt onset worse with walking, etc., she  presented to the Emergency Room and found to be hemodynamically stable.  Labs were unremarkable.  UA was pending.  The patient was admitted for  observation and pain control and further diagnostic workup with CT of  the abdomen and pelvis.   LABORATORY STUDIES:  On May 8, WBC of 3.9, hemoglobin 11.6, hematocrit  of 34.2, MCV 94, platelets 203,000.  Serial values were obtained on May  11, WBC of 4, hemoglobin 11.4, hematocrit of 34.2, platelets 172,000.  Sodium 140, potassium 3.3, glucose 95, creatinine less than 0.3.  Liver  function studies normal.  Albumin 3.7, lipase 18.  UA negative.  Urine  culture no growth, plain.   X-RAY STUDIES:  Plain abdominal films on May 8, no acute abnormality.   BRIEF HOSPITAL COURSE:  The patient was admitted to the service of Dr.  Lina Sar who was covering the hospital after being evaluated in the  Emergency Room.  She was given IV Zofran and IV morphine to help control  her pain, placed on IV fluids, and initially kept NPO.  We obtained CT  scan of the abdomen and pelvis which did not show any acute abnormality  to explain her pain.  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  The right ovary was not discreetly  identified.  There was no free fluid.  The mid portion of the appendix  was dilated to 11 mm, and there was air and debris along the length of  the appendix, but the tip of the appendix was not dilated.  Therefore  not felt consistent with an acute appendicitis.  She was seen in  consultation by Dr. Gerrit Friends for surgery who did not feel that she had   acute appendicitis and did not feel there was any indication for  surgical intervention.  Her pain gradually improved.  We were able to  advance her diet, and by May 11, she was tolerating a regular diet,  having minimal lower abdominal discomfort, and was allowed discharge to  home with instructions to followup with Dr. Christella Hartigan in the office in 1  week.  She was discharged on Cipro 500 mg b.i.d. to finish a 7-day  course.  Diet as tolerated.      Amy Esterwood, PA-C      Robert D. Arlyce Dice, MD,FACG  Electronically Signed    AE/MEDQ  D:  07/08/2006  T:  07/08/2006  Job:  [id number]   cc:   Office

## 2010-12-02 LAB — URINALYSIS, ROUTINE W REFLEX MICROSCOPIC
Bilirubin Urine: NEGATIVE
Glucose, UA: NEGATIVE
Leukocytes, UA: NEGATIVE
Nitrite: NEGATIVE
Specific Gravity, Urine: 1.02
pH: 6

## 2010-12-02 LAB — COMPREHENSIVE METABOLIC PANEL
ALT: 32
BUN: 8
CO2: 25
Calcium: 9.1
GFR calc Af Amer: >60
GFR calc non Af Amer: >60
Potassium: 3.9
Sodium: 141
Total Protein: 6.8

## 2010-12-02 LAB — CBC
HCT: 29.4 — ABNORMAL LOW
HCT: 37.5
MCV: 94.4
MCV: 95.8
Platelets: 165
RBC: 3.97
RDW: 12.9

## 2010-12-02 LAB — PREGNANCY, URINE: Preg Test, Ur: NEGATIVE

## 2010-12-02 LAB — URINE MICROSCOPIC-ADD ON

## 2011-08-27 ENCOUNTER — Encounter: Payer: Self-pay | Admitting: Internal Medicine

## 2011-08-27 DIAGNOSIS — Z0000 Encounter for general adult medical examination without abnormal findings: Secondary | ICD-10-CM | POA: Insufficient documentation

## 2011-08-28 ENCOUNTER — Encounter: Payer: Self-pay | Admitting: Internal Medicine

## 2011-08-28 ENCOUNTER — Ambulatory Visit (INDEPENDENT_AMBULATORY_CARE_PROVIDER_SITE_OTHER): Payer: BC Managed Care – PPO | Admitting: Internal Medicine

## 2011-08-28 VITALS — BP 102/80 | HR 79 | Temp 97.0°F | Ht 69.0 in | Wt 170.2 lb

## 2011-08-28 DIAGNOSIS — J45909 Unspecified asthma, uncomplicated: Secondary | ICD-10-CM | POA: Insufficient documentation

## 2011-08-28 DIAGNOSIS — F329 Major depressive disorder, single episode, unspecified: Secondary | ICD-10-CM

## 2011-08-28 DIAGNOSIS — Z0000 Encounter for general adult medical examination without abnormal findings: Secondary | ICD-10-CM

## 2011-08-28 DIAGNOSIS — N200 Calculus of kidney: Secondary | ICD-10-CM | POA: Insufficient documentation

## 2011-08-28 DIAGNOSIS — I10 Essential (primary) hypertension: Secondary | ICD-10-CM

## 2011-08-28 DIAGNOSIS — J309 Allergic rhinitis, unspecified: Secondary | ICD-10-CM | POA: Insufficient documentation

## 2011-08-28 DIAGNOSIS — I2699 Other pulmonary embolism without acute cor pulmonale: Secondary | ICD-10-CM | POA: Insufficient documentation

## 2011-08-28 MED ORDER — PREDNISONE 10 MG PO TABS: ORAL_TABLET | ORAL | Status: DC

## 2011-08-28 MED ORDER — FLUTICASONE PROPIONATE 50 MCG/ACT NA SUSP: 2.0000 | Freq: Every day | NASAL | Status: DC

## 2011-08-28 MED ORDER — METHYLPREDNISOLONE ACETATE 80 MG/ML IJ SUSP
120.0000 mg | Freq: Once | INTRAMUSCULAR | Status: AC
  Administered 2011-08-28: 120 mg via INTRAMUSCULAR

## 2011-08-28 MED ORDER — FEXOFENADINE HCL 180 MG PO TABS: 180.0000 mg | ORAL_TABLET | Freq: Every day | ORAL | Status: DC

## 2011-08-28 NOTE — Patient Instructions (Addendum)
You had the steroid shot today Take all new medications as prescribed - the prednisone, generic allegra and flonase Continue all other medications as before Please remember to followup with your GYN for the yearly mammogram as you do Please return in 1 year for your yearly visit, or sooner if needed, with Lab testing done 3-5 days before

## 2011-08-29 ENCOUNTER — Encounter: Payer: Self-pay | Admitting: Internal Medicine

## 2011-08-29 NOTE — Assessment & Plan Note (Addendum)
Mild to mod, for depomedrol IM, predpack asd, adn allegra/flonase asd,  to f/u any worsening symptoms or concerns

## 2011-08-29 NOTE — Assessment & Plan Note (Signed)
stable overall by hx and exam, most recent data reviewed with pt, and pt to continue medical treatment as before Lab Results  Component Value Date   WBC 4.5 04/16/2008   HGB 13.1 04/16/2008   HCT 38.4 04/16/2008   PLT 175 04/16/2008   GLUCOSE 94 04/16/2008   CHOL 199 04/16/2008   TRIG 63 04/16/2008   HDL 52.2 04/16/2008   LDLCALC 134* 04/16/2008   ALT 15 04/16/2008   AST 27 04/16/2008   NA 141 04/16/2008   K 3.4* 04/16/2008   CL 106 04/16/2008   CREATININE 0.7 04/16/2008   BUN 10 04/16/2008   CO2 27 04/16/2008   TSH 0.95 04/16/2008

## 2011-08-29 NOTE — Assessment & Plan Note (Signed)
stable overall by hx and exam, most recent data reviewed with pt, and pt to continue medical treatment as before BP Readings from Last 3 Encounters:  08/28/11 102/80  05/02/08 102/78  04/16/08 140/84

## 2011-08-29 NOTE — Progress Notes (Signed)
Subjective:    Patient ID: Deborah Pratt, female    DOB: Jul 31, 1955, 56 y.o.   MRN: [id number]  HPI  Here to re-establish ;  Does have several wks ongoing nasal allergy symptoms with clear congestion, post nasal gtt and cough - hard to sleep at night, but no itch and sneeze, without fever, pain, ST, or wheezing.   Pt denies fever, wt loss, night sweats, loss of appetite, or other constitutional symptoms  Pt denies chest pain, increased sob or doe, wheezing, orthopnea, PND, increased LE swelling, palpitations, dizziness or syncope.   Pt denies polydipsia, polyuria.  Pt denies new neurological symptoms such as new headache, or facial or extremity weakness or numbness   Denies worsening depressive symptoms, suicidal ideation, or panic Past Medical History  Diagnosis Date  . HYPERTENSION 11/08/2006    Qualifier: Diagnosis of  By: Maris Berger   . CHOLELITHIASIS 11/08/2006    Qualifier: Diagnosis of  By: Maris Berger   . Generalized headaches   . UTI (lower urinary tract infection)   . Kidney stones   . Allergy   . ANXIETY 11/11/2006    Qualifier: Diagnosis of  By: Jonny Ruiz MD, Len Blalock   . Asthma   . DEPRESSION 11/11/2006    Qualifier: Diagnosis of  By: Jonny Ruiz MD, Len Blalock   . Pulmonary embolism 56 yo     on BCP   Past Surgical History  Procedure Date  . Tubal ligation   . Abdominal hysterectomy     reports that she has been smoking.  She has never used smokeless tobacco. She reports that she drinks alcohol. She reports that she does not use illicit drugs. family history includes Arthritis in her others; Gout in her father; Heart disease in her mother and other; Hypertension in her other; and Stroke in her other. Allergies  Allergen Reactions  . Acetaminophen   . Aspirin   . Latex   . Morphine   . Penicillins    Current Outpatient Prescriptions on File Prior to Visit  Medication Sig Dispense Refill  . fexofenadine (ALLEGRA) 180 MG tablet Take 1 tablet (180 mg  total) by mouth daily.  90 tablet  3  . fluticasone (FLONASE) 50 MCG/ACT nasal spray Place 2 sprays into the nose daily.  16 g  5   No current facility-administered medications on file prior to visit.   Review of Systems Review of Systems  Constitutional: Negative for diaphoresis and unexpected weight change.  HENT: Negative for tinnitus.   Eyes: Negative for photophobia and visual disturbance.  Respiratory: Negative for choking and stridor.   Gastrointestinal: Negative for vomiting and blood in stool.  Genitourinary: Negative for hematuria and decreased urine volume.  Musculoskeletal: Negative for gait problem.  Skin: Negative for color change and wound.  Neurological: Negative for tremors and numbness. .      Objective:   Physical Exam BP 102/80  Pulse 79  Temp 97 F (36.1 C) (Oral)  Ht 5\' 9"  (1.753 m)  Wt 170 lb 4 oz (77.225 kg)  BMI 25.14 kg/m2  SpO2 98% Physical Exam  VS noted Constitutional: Pt appears well-developed and well-nourished.  HENT: Head: Normocephalic.  Right Ear: External ear normal.  Left Ear: External ear normal.  Bilat tm's mild erythema.  Sinus nontender.  Pharynx mild erythema Eyes: Conjunctivae and EOM are normal. Pupils are equal, round, and reactive to light.  Neck: Normal range of motion. Neck supple.  Cardiovascular: Normal rate and regular rhythm.  Pulmonary/Chest: Effort normal and breath sounds normal.  Neurological: Pt is alert.  Skin: Skin is warm. No erythema.  Psychiatric: Pt behavior is normal. Thought content normal. not deperessed affect    Assessment & Plan:

## 2012-01-01 ENCOUNTER — Telehealth: Payer: Self-pay | Admitting: Internal Medicine

## 2012-01-01 NOTE — Telephone Encounter (Signed)
Ok to make appt with regina to address

## 2012-01-01 NOTE — Telephone Encounter (Signed)
Patient Information:  Caller Name: Emberly  Phone: [phone]  Patient: Deborah Pratt, Deborah Pratt  Gender: Female  DOB: 1955/05/24  Age: 56 Years  PCP: Oliver Barre (Adults only)   Symptoms  Reason For Call & Symptoms: Localized itching on back of neck  Reviewed Health History In EMR: Yes  Reviewed Medications In EMR: Yes  Reviewed Allergies In EMR: Yes  Date of Onset of Symptoms: Unknown  Guideline(s) Used:  Itching - Localized  Disposition Per Guideline:   See Within 3 Days in Office  Reason For Disposition Reached:   Cause unknown and present > 7 days  Advice Given:  Call Back If:  Itching becomes severe  You become worse.  Hydrocortisone Cream For Itching:  Apply hydrocortisone cream 4 times a day. Use it for a couple days, until the itch is mild.  Wash Off Irritants:  Wash the area once with soap to remove any remaining irritants. Rinse off the soap.  Then avoid using soap on this area. Sometimes soap can dry out the skin and make the skin more itchy.  Office Follow Up:  Does the office need to follow up with this patient?: Yes  Instructions For The Office: Please f/u with pt regarding appt and request for Shingles Immunization  RN Note:  Itching seems to start after she wears her work uniform.  Appt advised within 3 days.  Pt also requesting Shingles immunization during visit.

## 2012-01-04 NOTE — Telephone Encounter (Signed)
Called left message to call back 

## 2012-01-04 NOTE — Telephone Encounter (Signed)
Called the patient left message to call back 

## 2012-01-05 NOTE — Telephone Encounter (Signed)
Patient informed and unsure if insurance will cover OV.  She will call back once informed.

## 2012-01-05 NOTE — Telephone Encounter (Signed)
Called left message to call back 

## 2012-01-07 ENCOUNTER — Ambulatory Visit (INDEPENDENT_AMBULATORY_CARE_PROVIDER_SITE_OTHER): Payer: BC Managed Care – PPO | Admitting: *Deleted

## 2012-01-07 DIAGNOSIS — Z23 Encounter for immunization: Secondary | ICD-10-CM

## 2012-01-07 DIAGNOSIS — Z2911 Encounter for prophylactic immunotherapy for respiratory syncytial virus (RSV): Secondary | ICD-10-CM

## 2012-03-03 ENCOUNTER — Encounter: Payer: Self-pay | Admitting: Internal Medicine

## 2012-03-03 ENCOUNTER — Ambulatory Visit (INDEPENDENT_AMBULATORY_CARE_PROVIDER_SITE_OTHER): Payer: BC Managed Care – PPO | Admitting: Internal Medicine

## 2012-03-03 VITALS — BP 118/80 | HR 84 | Temp 97.6°F | Ht 69.0 in | Wt 169.0 lb

## 2012-03-03 DIAGNOSIS — F3289 Other specified depressive episodes: Secondary | ICD-10-CM

## 2012-03-03 DIAGNOSIS — R0683 Snoring: Secondary | ICD-10-CM

## 2012-03-03 DIAGNOSIS — J309 Allergic rhinitis, unspecified: Secondary | ICD-10-CM

## 2012-03-03 DIAGNOSIS — I10 Essential (primary) hypertension: Secondary | ICD-10-CM

## 2012-03-03 DIAGNOSIS — F329 Major depressive disorder, single episode, unspecified: Secondary | ICD-10-CM

## 2012-03-03 DIAGNOSIS — R0609 Other forms of dyspnea: Secondary | ICD-10-CM

## 2012-03-03 MED ORDER — PREDNISONE 10 MG PO TABS: ORAL_TABLET | ORAL | Status: DC

## 2012-03-03 MED ORDER — FEXOFENADINE HCL 180 MG PO TABS: 180.0000 mg | ORAL_TABLET | Freq: Every day | ORAL | Status: DC

## 2012-03-03 MED ORDER — METHYLPREDNISOLONE ACETATE 80 MG/ML IJ SUSP
120.0000 mg | Freq: Once | INTRAMUSCULAR | Status: AC
  Administered 2012-03-03: 120 mg via INTRAMUSCULAR

## 2012-03-03 MED ORDER — FLUTICASONE PROPIONATE 50 MCG/ACT NA SUSP: 2.0000 | Freq: Every day | NASAL | Status: DC

## 2012-03-03 NOTE — Assessment & Plan Note (Signed)
stable overall by history and exam, recent data reviewed with pt, and pt to continue medical treatment as before,  to f/u any worsening symptoms or concerns Lab Results  Component Value Date   WBC 4.5 04/16/2008   HGB 13.1 04/16/2008   HCT 38.4 04/16/2008   PLT 175 04/16/2008   GLUCOSE 94 04/16/2008   CHOL 199 04/16/2008   TRIG 63 04/16/2008   HDL 52.2 04/16/2008   LDLCALC 134* 04/16/2008   ALT 15 04/16/2008   AST 27 04/16/2008   NA 141 04/16/2008   K 3.4* 04/16/2008   CL 106 04/16/2008   CREATININE 0.7 04/16/2008   BUN 10 04/16/2008   CO2 27 04/16/2008   TSH 0.95 04/16/2008

## 2012-03-03 NOTE — Patient Instructions (Addendum)
Your EKG was OK today Please take all new medication as prescribed - the prednisone Please continue all other medications as before, and refills have been done You will be contacted regarding the referral for: ENT

## 2012-03-03 NOTE — Assessment & Plan Note (Signed)
stable overall by history and exam, recent data reviewed with pt, and pt to continue medical treatment as before,  to f/u any worsening symptoms or concerns BP Readings from Last 3 Encounters:  03/03/12 118/80  08/28/11 102/80  05/02/08 102/78

## 2012-03-03 NOTE — Assessment & Plan Note (Signed)
Severe by hx it seems, for ENT eval

## 2012-03-03 NOTE — Assessment & Plan Note (Signed)
Most likely etiology of nasal symptoms, for depomedrol IM, predpack asd, cont other meds, consider allergy referral, but due to snoring will refer ENT first

## 2012-03-03 NOTE — Progress Notes (Signed)
Subjective:    Patient ID: Deborah Pratt, female    DOB: 09-21-1955, 57 y.o.   MRN: [id number]  HPI  Here to f/u;  Does have several wks ongoing nasal allergy symptoms with clearish congestion, itch and sneezing, without fever, pain, ST, cough, swelling or wheezing, but has severe nighttime snoring, and lack of smell for several months, family now moved out of the bedroom b/c so severe, and she seems to gasp with sleeping.  No increaed daytime somnolence.  Pt denies chest pain, increased sob or doe, wheezing, orthopnea, PND, increased LE swelling, palpitations, dizziness or syncope.   Pt denies polydipsia, polyuria. Pt denies new neurological symptoms such as new headache, or facial or extremity weakness or numbness.  Prednisone helped temporarily after last visit, but now very stuffed up, oral meds not working so well Denies worsening depressive symptoms, suicidal ideation, or panic Past Medical History  Diagnosis Date  . HYPERTENSION 11/08/2006    Qualifier: Diagnosis of  By: Maris Berger   . CHOLELITHIASIS 11/08/2006    Qualifier: Diagnosis of  By: Maris Berger   . Generalized headaches   . UTI (lower urinary tract infection)   . Kidney stones   . Allergy   . ANXIETY 11/11/2006    Qualifier: Diagnosis of  By: Jonny Ruiz MD, Len Blalock   . Asthma   . DEPRESSION 11/11/2006    Qualifier: Diagnosis of  By: Jonny Ruiz MD, Len Blalock   . Pulmonary embolism 57 yo     on BCP   Past Surgical History  Procedure Date  . Tubal ligation   . Abdominal hysterectomy     reports that she has been smoking.  She has never used smokeless tobacco. She reports that she drinks alcohol. She reports that she does not use illicit drugs. family history includes Arthritis in her others; Gout in her father; Heart disease in her mother and other; Hypertension in her other; and Stroke in her other. Allergies  Allergen Reactions  . Acetaminophen   . Aspirin   . Latex   . Morphine   . Penicillins       Review of Systems  Constitutional: Negative for unexpected weight change, or unusual diaphoresis  HENT: Negative for tinnitus.   Eyes: Negative for photophobia and visual disturbance.  Respiratory: Negative for choking and stridor.   Gastrointestinal: Negative for vomiting and blood in stool.  Genitourinary: Negative for hematuria and decreased urine volume.  Musculoskeletal: Negative for acute joint swelling Skin: Negative for color change and wound.  Neurological: Negative for tremors and numbness other than noted  Psychiatric/Behavioral: Negative for decreased concentration or  hyperactivity.       Objective:   Physical Exam BP 118/80  Pulse 84  Temp 97.6 F (36.4 C) (Oral)  Ht 5\' 9"  (1.753 m)  Wt 169 lb (76.658 kg)  BMI 24.96 kg/m2  SpO2 98% VS noted, not ill appaering Constitutional: Pt appears well-developed and well-nourished.  HENT: Head: NCAT.  Right Ear: External ear normal.  Left Ear: External ear normal. Bilat tm's with mild erythema.  Max sinus areas non tender.  Pharynx with mild erythema, no exudate  Eyes: Conjunctivae and EOM are normal. Pupils are equal, round, and reactive to light.  Neck: Normal range of motion. Neck supple.  Cardiovascular: Normal rate and regular rhythm.   Pulmonary/Chest: Effort normal and breath sounds normal.  Neurological: Pt is alert. Not confused  Skin: Skin is warm. No erythema.  Psychiatric: Pt behavior is normal. Thought content  normal.     Assessment & Plan:

## 2012-03-15 ENCOUNTER — Other Ambulatory Visit: Payer: Self-pay | Admitting: Otolaryngology

## 2012-03-15 DIAGNOSIS — J329 Chronic sinusitis, unspecified: Secondary | ICD-10-CM

## 2012-04-04 ENCOUNTER — Other Ambulatory Visit: Payer: BC Managed Care – PPO

## 2012-04-11 ENCOUNTER — Ambulatory Visit
Admission: RE | Admit: 2012-04-11 | Discharge: 2012-04-11 | Disposition: A | Discharge status: Home / Self Care | Payer: BC Managed Care – PPO | Source: Ambulatory Visit | Attending: Otolaryngology | Admitting: Otolaryngology

## 2012-04-11 DIAGNOSIS — J329 Chronic sinusitis, unspecified: Secondary | ICD-10-CM

## 2012-08-29 ENCOUNTER — Encounter: Payer: BC Managed Care – PPO | Admitting: Internal Medicine

## 2012-09-22 ENCOUNTER — Encounter: Payer: Self-pay | Admitting: Internal Medicine

## 2012-09-22 ENCOUNTER — Ambulatory Visit (INDEPENDENT_AMBULATORY_CARE_PROVIDER_SITE_OTHER): Payer: BC Managed Care – PPO | Admitting: Internal Medicine

## 2012-09-22 ENCOUNTER — Other Ambulatory Visit (INDEPENDENT_AMBULATORY_CARE_PROVIDER_SITE_OTHER): Payer: BC Managed Care – PPO

## 2012-09-22 VITALS — BP 122/80 | HR 92 | Temp 98.0°F | Ht 69.0 in | Wt 172.8 lb

## 2012-09-22 DIAGNOSIS — Z0000 Encounter for general adult medical examination without abnormal findings: Secondary | ICD-10-CM

## 2012-09-22 DIAGNOSIS — R7989 Other specified abnormal findings of blood chemistry: Secondary | ICD-10-CM

## 2012-09-22 DIAGNOSIS — Z23 Encounter for immunization: Secondary | ICD-10-CM

## 2012-09-22 LAB — URINALYSIS, ROUTINE W REFLEX MICROSCOPIC
Bilirubin Urine: NEGATIVE
Hgb urine dipstick: NEGATIVE
Ketones, ur: NEGATIVE
Leukocytes, UA: NEGATIVE
Nitrite: NEGATIVE
Specific Gravity, Urine: 1.025
Total Protein, Urine: NEGATIVE
Urine Glucose: NEGATIVE
Urobilinogen, UA: 0.2
pH: 6 (ref 5.0–8.0)

## 2012-09-22 LAB — HEPATIC FUNCTION PANEL: Albumin: 3.8 g/dL (ref 3.5–5.2)

## 2012-09-22 LAB — CBC WITH DIFFERENTIAL/PLATELET
Eosinophils Relative: 3.7 % (ref 0.0–5.0)
HCT: 39.4 % (ref 36.0–46.0)
Hemoglobin: 13 g/dL (ref 12.0–15.0)
Lymphs Abs: 2.5 10*3/uL (ref 0.7–4.0)
Monocytes Relative: 11 % (ref 3.0–12.0)
Neutro Abs: 1.6 10*3/uL (ref 1.4–7.7)
WBC: 4.9 10*3/uL (ref 4.5–10.5)

## 2012-09-22 LAB — BASIC METABOLIC PANEL WITH GFR
BUN: 12 mg/dL (ref 6–23)
CO2: 26 meq/L (ref 19–32)
Calcium: 9.4 mg/dL (ref 8.4–10.5)
Chloride: 108 meq/L (ref 96–112)
Creatinine, Ser: 0.8 mg/dL (ref 0.4–1.2)
GFR: 90.91 mL/min
Glucose, Bld: 84 mg/dL (ref 70–99)
Potassium: 3.9 meq/L (ref 3.5–5.1)
Sodium: 142 meq/L (ref 135–145)

## 2012-09-22 LAB — LIPID PANEL
HDL: 50.8 mg/dL (ref >39.00)
Triglycerides: 80 mg/dL (ref 0.0–149.0)

## 2012-09-22 MED ORDER — ASPIRIN EC 81 MG PO TBEC: 81.0000 mg | DELAYED_RELEASE_TABLET | Freq: Every day | ORAL | Status: DC

## 2012-09-22 NOTE — Assessment & Plan Note (Signed)

## 2012-09-22 NOTE — Addendum Note (Signed)
Addended by: Scharlene Gloss B on: 09/22/2012 10:42 AM   Modules accepted: Orders

## 2012-09-22 NOTE — Progress Notes (Addendum)
Subjective:    Patient ID: Deborah Pratt, female    DOB: 19-May-1955, 57 y.o.   MRN: [id number]  HPI  Here for wellness and f/u;  Overall doing ok;  Pt denies CP, worsening SOB, DOE, wheezing, orthopnea, PND, worsening LE edema, palpitations, dizziness or syncope.  Pt denies neurological change such as new headache, facial or extremity weakness.  Pt denies polydipsia, polyuria, or low sugar symptoms. Pt states overall good compliance with treatment and medications, good tolerability, and has been trying to follow lower cholesterol diet.  Pt denies worsening depressive symptoms, suicidal ideation or panic. No fever, night sweats, wt loss, loss of appetite, or other constitutional symptoms.  Pt states good ability with ADL's, has low fall risk, home safety reviewed and adequate, no other significant changes in hearing or vision, and active with exercise. Regularly with 5 lb wts and excercises nearly every day. Drives city bus Past Medical History  Diagnosis Date  . HYPERTENSION 11/08/2006    Qualifier: Diagnosis of  By: Maris Berger   . CHOLELITHIASIS 11/08/2006    Qualifier: Diagnosis of  By: Maris Berger   . Generalized headaches   . UTI (lower urinary tract infection)   . Kidney stones   . Allergy   . ANXIETY 11/11/2006    Qualifier: Diagnosis of  By: Jonny Ruiz MD, Len Blalock   . Asthma   . DEPRESSION 11/11/2006    Qualifier: Diagnosis of  By: Jonny Ruiz MD, Len Blalock   . Pulmonary embolism 57 yo     on BCP   Past Surgical History  Procedure Laterality Date  . Tubal ligation    . Abdominal hysterectomy      reports that she has been smoking.  She has never used smokeless tobacco. She reports that  drinks alcohol. She reports that she does not use illicit drugs. family history includes Arthritis in her others; Gout in her father; Heart disease in her mother and other; Hypertension in her other; and Stroke in her other. Allergies  Allergen Reactions  . Acetaminophen   .  Aspirin   . Latex   . Morphine   . Penicillins    No current outpatient prescriptions on file prior to visit.   No current facility-administered medications on file prior to visit.   Review of Systems Constitutional: Negative for diaphoresis, activity change, appetite change or unexpected weight change.  HENT: Negative for hearing loss, ear pain, facial swelling, mouth sores and neck stiffness.   Eyes: Negative for pain, redness and visual disturbance.  Respiratory: Negative for shortness of breath and wheezing.   Cardiovascular: Negative for chest pain and palpitations.  Gastrointestinal: Negative for diarrhea, blood in stool, abdominal distention or other pain Genitourinary: Negative for hematuria, flank pain or change in urine volume.  Musculoskeletal: Negative for myalgias and joint swelling.  Skin: Negative for color change and wound.  Neurological: Negative for syncope and numbness. other than noted Hematological: Negative for adenopathy.  Psychiatric/Behavioral: Negative for hallucinations, self-injury, decreased concentration and agitation.      Objective:   Physical Exam BP 122/80  Pulse 92  Temp(Src) 98 F (36.7 C) (Oral)  Ht 5\' 9"  (1.753 m)  Wt 172 lb 12 oz (78.359 kg)  BMI 25.5 kg/m2  SpO2 94% VS noted,  Constitutional: Pt is oriented to person, place, and time. Appears well-developed and well-nourished.  Head: Normocephalic and atraumatic.  Right Ear: External ear normal.  Left Ear: External ear normal.  Nose: Nose normal.  Mouth/Throat: Oropharynx is  clear and moist.  Eyes: Conjunctivae and EOM are normal. Pupils are equal, round, and reactive to light.  Neck: Normal range of motion. Neck supple. No JVD present. No tracheal deviation present.  Cardiovascular: Normal rate, regular rhythm, normal heart sounds and intact distal pulses.   Pulmonary/Chest: Effort normal and breath sounds normal.  Abdominal: Soft. Bowel sounds are normal. There is no tenderness.  No HSM  Musculoskeletal: Normal range of motion. Exhibits no edema.  Lymphadenopathy:  Has no cervical adenopathy.  Neurological: Pt is alert and oriented to person, place, and time. Pt has normal reflexes. No cranial nerve deficit.  Skin: Skin is warm and dry. No rash noted.  Psychiatric:  Has  normal mood and affect. Behavior is normal.     Assessment & Plan:

## 2012-09-22 NOTE — Patient Instructions (Addendum)
Please remember to followup with your GYN for the yearly pap smear and/or mammogram Please start the Aspirin 81 gm per day - Enteric Coated only You had the tetanus shot today Please continue your efforts at being more active, low cholesterol diet, and weight control. You are otherwise up to date with prevention measures today. Please go to the LAB in the Basement (turn left off the elevator) for the tests to be done today You will be contacted by phone if any changes need to be made immediately.  Otherwise, you will receive a letter about your results with an explanation, but please check with MyChart first.  Please remember to sign up for My Chart if you have not done so, as this will be important to you in the future with finding out test results, communicating by private email, and scheduling acute appointments online when needed.  Please return in 1 year for your yearly visit, or sooner if needed, with Lab testing done 3-5 days before

## 2013-01-06 ENCOUNTER — Ambulatory Visit (INDEPENDENT_AMBULATORY_CARE_PROVIDER_SITE_OTHER): Payer: BC Managed Care – PPO | Admitting: Internal Medicine

## 2013-01-06 ENCOUNTER — Encounter: Payer: Self-pay | Admitting: Internal Medicine

## 2013-01-06 VITALS — BP 130/80 | HR 102 | Temp 98.7°F | Ht 69.0 in | Wt 174.0 lb

## 2013-01-06 DIAGNOSIS — F3289 Other specified depressive episodes: Secondary | ICD-10-CM

## 2013-01-06 DIAGNOSIS — I10 Essential (primary) hypertension: Secondary | ICD-10-CM

## 2013-01-06 DIAGNOSIS — J45909 Unspecified asthma, uncomplicated: Secondary | ICD-10-CM

## 2013-01-06 DIAGNOSIS — F329 Major depressive disorder, single episode, unspecified: Secondary | ICD-10-CM

## 2013-01-06 NOTE — Progress Notes (Signed)
Pre-visit discussion using our clinic review tool. No additional management support is needed unless otherwise documented below in the visit note.  

## 2013-01-06 NOTE — Progress Notes (Signed)
Subjective:    Patient ID: Deborah Pratt, female    DOB: 10-Nov-1955, 57 y.o.   MRN: [id number]  HPI  Here to f/u; needs form signed to state ok for become foster parent, overall doing ok,  Pt denies chest pain, increased sob or doe, wheezing, orthopnea, PND, increased LE swelling, palpitations, dizziness or syncope.  Pt denies polydipsia, polyuria, or low sugar symptoms such as weakness or confusion improved with po intake.  Pt denies new neurological symptoms such as new headache, or facial or extremity weakness or numbness.   Pt states overall good compliance with meds, has been trying to follow lower cholesterol diet, with wt overall stable,  but little exercise however. Denies worsening depressive symptoms, suicidal ideation, or panic; has ongoing anxiety, not increased recently.  Past Medical History  Diagnosis Date  . HYPERTENSION 11/08/2006    Qualifier: Diagnosis of  By: Maris Berger   . CHOLELITHIASIS 11/08/2006    Qualifier: Diagnosis of  By: Maris Berger   . Generalized headaches   . UTI (lower urinary tract infection)   . Kidney stones   . Allergy   . ANXIETY 11/11/2006    Qualifier: Diagnosis of  By: Jonny Ruiz MD, Len Blalock   . Asthma   . DEPRESSION 11/11/2006    Qualifier: Diagnosis of  By: Jonny Ruiz MD, Len Blalock   . Pulmonary embolism 57 yo     on BCP   Past Surgical History  Procedure Laterality Date  . Tubal ligation    . Abdominal hysterectomy      reports that she has been smoking.  She has never used smokeless tobacco. She reports that she drinks alcohol. She reports that she does not use illicit drugs. family history includes Arthritis in her other and other; Gout in her father; Heart disease in her mother and other; Hypertension in her other; Stroke in her other. Allergies  Allergen Reactions  . Acetaminophen   . Latex   . Morphine   . Penicillins    Current Outpatient Prescriptions on File Prior to Visit  Medication Sig Dispense Refill  .  aspirin EC 81 MG tablet Take 1 tablet (81 mg total) by mouth daily.  30 tablet  12   No current facility-administered medications on file prior to visit.   Review of Systems  Constitutional: Negative for unexpected weight change, or unusual diaphoresis  HENT: Negative for tinnitus.   Eyes: Negative for photophobia and visual disturbance.  Respiratory: Negative for choking and stridor.   Gastrointestinal: Negative for vomiting and blood in stool.  Genitourinary: Negative for hematuria and decreased urine volume.  Musculoskeletal: Negative for acute joint swelling Skin: Negative for color change and wound.  Neurological: Negative for tremors and numbness other than noted  Psychiatric/Behavioral: Negative for decreased concentration or  hyperactivity.       Objective:   Physical Exam BP 130/80  Pulse 102  Temp(Src) 98.7 F (37.1 C) (Oral)  Ht 5\' 9"  (1.753 m)  Wt 174 lb (78.926 kg)  BMI 25.68 kg/m2  SpO2 96% VS noted, not ill appearing Constitutional: Pt appears well-developed and well-nourished.  HENT: Head: NCAT.  Right Ear: External ear normal.  Left Ear: External ear normal.  Eyes: Conjunctivae and EOM are normal. Pupils are equal, round, and reactive to light.  Neck: Normal range of motion. Neck supple.  Cardiovascular: Normal rate and regular rhythm.   Pulmonary/Chest: Effort normal and breath sounds normal.  Abd:  Soft, NT, non-distended, + BS Neurological: Pt is  alert. Not confused , motor 5/5 Skin: Skin is warm. No erythema.  Psychiatric: Pt behavior is normal. Thought content normal.     Assessment & Plan:

## 2013-01-06 NOTE — Patient Instructions (Signed)
Please continue all other medications as before, and refills have been done if requested.  Your form was filled out today

## 2013-01-07 NOTE — Assessment & Plan Note (Signed)
stable overall by history and exam, recent data reviewed with pt, and pt to continue medical treatment as before,  to f/u any worsening symptoms or concerns Lab Results  Component Value Date   WBC 4.9 09/22/2012   HGB 13.0 09/22/2012   HCT 39.4 09/22/2012   PLT 191.0 09/22/2012   GLUCOSE 84 09/22/2012   CHOL 206* 09/22/2012   TRIG 80.0 09/22/2012   HDL 50.80 09/22/2012   LDLDIRECT 134.9 09/22/2012   LDLCALC 134* 04/16/2008   ALT 22 09/22/2012   AST 25 09/22/2012   NA 142 09/22/2012   K 3.9 09/22/2012   CL 108 09/22/2012   CREATININE 0.8 09/22/2012   BUN 12 09/22/2012   CO2 26 09/22/2012   TSH 1.21 09/22/2012

## 2013-01-07 NOTE — Assessment & Plan Note (Signed)
stable overall by history and exam, recent data reviewed with pt, and pt to continue medical treatment as before,  to f/u any worsening symptoms or concerns BP Readings from Last 3 Encounters:  01/06/13 130/80  09/22/12 122/80  03/03/12 118/80

## 2013-01-07 NOTE — Assessment & Plan Note (Signed)
stable overall by history and exam, recent data reviewed with pt, and pt to continue medical treatment as before,  to f/u any worsening symptoms or concerns SpO2 Readings from Last 3 Encounters:  01/06/13 96%  09/22/12 94%  03/03/12 98%

## 2013-01-18 ENCOUNTER — Ambulatory Visit (INDEPENDENT_AMBULATORY_CARE_PROVIDER_SITE_OTHER): Payer: BC Managed Care – PPO

## 2013-01-18 DIAGNOSIS — Z111 Encounter for screening for respiratory tuberculosis: Secondary | ICD-10-CM

## 2013-01-20 LAB — TB SKIN TEST: Induration: 0 mm

## 2013-03-03 ENCOUNTER — Ambulatory Visit (INDEPENDENT_AMBULATORY_CARE_PROVIDER_SITE_OTHER): Payer: BC Managed Care – PPO | Admitting: Family Medicine

## 2013-03-03 ENCOUNTER — Encounter: Payer: Self-pay | Admitting: *Deleted

## 2013-03-03 ENCOUNTER — Encounter: Payer: Self-pay | Admitting: Family Medicine

## 2013-03-03 VITALS — BP 130/80 | HR 99 | Temp 99.5°F | Resp 18 | Wt 170.8 lb

## 2013-03-03 DIAGNOSIS — J111 Influenza due to unidentified influenza virus with other respiratory manifestations: Secondary | ICD-10-CM

## 2013-03-03 MED ORDER — OSELTAMIVIR PHOSPHATE 75 MG PO CAPS: 75.0000 mg | ORAL_CAPSULE | Freq: Two times a day (BID) | ORAL | Status: DC

## 2013-03-03 MED ORDER — BENZONATATE 200 MG PO CAPS
200.0000 mg | ORAL_CAPSULE | Freq: Three times a day (TID) | ORAL | Status: DC | PRN
Start: 2013-03-03 — End: 2013-06-15

## 2013-03-03 NOTE — Progress Notes (Signed)
SUBJECTIVE:  Deborah Pratt is a 58 y.o. female who present complaining of flu-like symptoms: fevers, chills, myalgias, congestion, sore throat and cough for 2 days. Denies dyspnea or wheezing. Positive sick contacts with patient's family including her son who was just a positive flu.  OBJECTIVE: Blood pressure 130/80, pulse 99, temperature 99.5 F (37.5 C), temperature source Oral, resp. rate 18, weight 170 lb 12.8 oz (77.474 kg), SpO2 96.00%.  Appears moderately ill but not toxic; temperature as noted in vitals. Ears normal. Throat and pharynx mild erythema.  Neck supple. No adenopathy in the neck. Sinuses non tender. The chest is clear.  ASSESSMENT: Influenza  PLAN: Symptomatic therapy suggested: rest, apply cold packs prn and call prn if symptoms persist or worsen. Call or return to clinic prn if these symptoms worsen or fail to improve as anticipated.Tamiflu given .

## 2013-03-03 NOTE — Patient Instructions (Addendum)
tamiflu as directed Ibuprofen 600mg  2 times a day Tessalon perles for cough.   Influenza, Adult Influenza ("the flu") is a viral infection of the respiratory tract. It occurs more often in winter months because people spend more time in close contact with one another. Influenza can make you feel very sick. Influenza easily spreads from person to person (contagious). CAUSES  Influenza is caused by a virus that infects the respiratory tract. You can catch the virus by breathing in droplets from an infected person's cough or sneeze. You can also catch the virus by touching something that was recently contaminated with the virus and then touching your mouth, nose, or eyes. SYMPTOMS  Symptoms typically last 4 to 10 days and may include:  Fever.  Chills.  Headache, body aches, and muscle aches.  Sore throat.  Chest discomfort and cough.  Poor appetite.  Weakness or feeling tired.  Dizziness.  Nausea or vomiting. DIAGNOSIS  Diagnosis of influenza is often made based on your history and a physical exam. A nose or throat swab test can be done to confirm the diagnosis. RISKS AND COMPLICATIONS You may be at risk for a more severe case of influenza if you smoke cigarettes, have diabetes, have chronic heart disease (such as heart failure) or lung disease (such as asthma), or if you have a weakened immune system. Elderly people and pregnant women are also at risk for more serious infections. The most common complication of influenza is a lung infection (pneumonia). Sometimes, this complication can require emergency medical care and may be life-threatening. PREVENTION  An annual influenza vaccination (flu shot) is the best way to avoid getting influenza. An annual flu shot is now routinely recommended for all adults in the [place] TREATMENT  In mild cases, influenza goes away on its own. Treatment is directed at relieving symptoms. For more severe cases, your caregiver may prescribe antiviral  medicines to shorten the sickness. Antibiotic medicines are not effective, because the infection is caused by a virus, not by bacteria. HOME CARE INSTRUCTIONS  Only take over-the-counter or prescription medicines for pain, discomfort, or fever as directed by your caregiver.  Use a cool mist humidifier to make breathing easier.  Get plenty of rest until your temperature returns to normal. This usually takes 3 to 4 days.  Drink enough fluids to keep your urine clear or pale yellow.  Cover your mouth and nose when coughing or sneezing, and wash your hands well to avoid spreading the virus.  Stay home from work or school until your fever has been gone for at least 1 full day. SEEK MEDICAL CARE IF:   You have chest pain or a deep cough that worsens or produces more mucus.  You have nausea, vomiting, or diarrhea. SEEK IMMEDIATE MEDICAL CARE IF:   You have difficulty breathing, shortness of breath, or your skin or nails turn bluish.  You have severe neck pain or stiffness.  You have a severe headache, facial pain, or earache.  You have a worsening or recurring fever.  You have nausea or vomiting that cannot be controlled. MAKE SURE YOU:  Understand these instructions.  Will watch your condition.  Will get help right away if you are not doing well or get worse. Document Released: 01/31/2000 Document Revised: 08/04/2011 Document Reviewed: 05/04/2011 [facility] CenterExitCare Patient Information 2014 [employer], [place].

## 2013-06-15 ENCOUNTER — Encounter (HOSPITAL_COMMUNITY): Payer: Self-pay | Admitting: Emergency Medicine

## 2013-06-15 ENCOUNTER — Emergency Department (HOSPITAL_COMMUNITY): Payer: BC Managed Care – PPO

## 2013-06-15 ENCOUNTER — Emergency Department (HOSPITAL_COMMUNITY)
Admission: EM | Admit: 2013-06-15 | Discharge: 2013-06-16 | Disposition: A | Discharge status: Home / Self Care | Payer: BC Managed Care – PPO | Attending: Emergency Medicine | Admitting: Emergency Medicine

## 2013-06-15 DIAGNOSIS — Z87442 Personal history of urinary calculi: Secondary | ICD-10-CM | POA: Insufficient documentation

## 2013-06-15 DIAGNOSIS — Z79899 Other long term (current) drug therapy: Secondary | ICD-10-CM | POA: Insufficient documentation

## 2013-06-15 DIAGNOSIS — Z880 Allergy status to penicillin: Secondary | ICD-10-CM | POA: Insufficient documentation

## 2013-06-15 DIAGNOSIS — S4980XA Other specified injuries of shoulder and upper arm, unspecified arm, initial encounter: Secondary | ICD-10-CM | POA: Insufficient documentation

## 2013-06-15 DIAGNOSIS — S199XXA Unspecified injury of neck, initial encounter: Secondary | ICD-10-CM

## 2013-06-15 DIAGNOSIS — S99929A Unspecified injury of unspecified foot, initial encounter: Secondary | ICD-10-CM

## 2013-06-15 DIAGNOSIS — Z87440 Personal history of urinary (tract) infections: Secondary | ICD-10-CM | POA: Insufficient documentation

## 2013-06-15 DIAGNOSIS — S0993XA Unspecified injury of face, initial encounter: Secondary | ICD-10-CM | POA: Insufficient documentation

## 2013-06-15 DIAGNOSIS — Z8719 Personal history of other diseases of the digestive system: Secondary | ICD-10-CM | POA: Insufficient documentation

## 2013-06-15 DIAGNOSIS — IMO0002 Reserved for concepts with insufficient information to code with codable children: Secondary | ICD-10-CM | POA: Insufficient documentation

## 2013-06-15 DIAGNOSIS — Z8659 Personal history of other mental and behavioral disorders: Secondary | ICD-10-CM | POA: Insufficient documentation

## 2013-06-15 DIAGNOSIS — Y92410 Unspecified street and highway as the place of occurrence of the external cause: Secondary | ICD-10-CM | POA: Insufficient documentation

## 2013-06-15 DIAGNOSIS — S46909A Unspecified injury of unspecified muscle, fascia and tendon at shoulder and upper arm level, unspecified arm, initial encounter: Secondary | ICD-10-CM | POA: Insufficient documentation

## 2013-06-15 DIAGNOSIS — F17200 Nicotine dependence, unspecified, uncomplicated: Secondary | ICD-10-CM | POA: Insufficient documentation

## 2013-06-15 DIAGNOSIS — Z91040 Latex allergy status: Secondary | ICD-10-CM | POA: Insufficient documentation

## 2013-06-15 DIAGNOSIS — S99919A Unspecified injury of unspecified ankle, initial encounter: Secondary | ICD-10-CM

## 2013-06-15 DIAGNOSIS — S0990XA Unspecified injury of head, initial encounter: Secondary | ICD-10-CM | POA: Insufficient documentation

## 2013-06-15 DIAGNOSIS — I10 Essential (primary) hypertension: Secondary | ICD-10-CM | POA: Insufficient documentation

## 2013-06-15 DIAGNOSIS — J45909 Unspecified asthma, uncomplicated: Secondary | ICD-10-CM | POA: Insufficient documentation

## 2013-06-15 DIAGNOSIS — S298XXA Other specified injuries of thorax, initial encounter: Secondary | ICD-10-CM | POA: Insufficient documentation

## 2013-06-15 DIAGNOSIS — Z7982 Long term (current) use of aspirin: Secondary | ICD-10-CM | POA: Insufficient documentation

## 2013-06-15 DIAGNOSIS — Z86711 Personal history of pulmonary embolism: Secondary | ICD-10-CM | POA: Insufficient documentation

## 2013-06-15 DIAGNOSIS — Y9389 Activity, other specified: Secondary | ICD-10-CM | POA: Insufficient documentation

## 2013-06-15 DIAGNOSIS — S8990XA Unspecified injury of unspecified lower leg, initial encounter: Secondary | ICD-10-CM | POA: Insufficient documentation

## 2013-06-15 MED ORDER — ONDANSETRON 4 MG PO TBDP
8.0000 mg | ORAL_TABLET | Freq: Once | ORAL | Status: AC
  Administered 2013-06-15: 8 mg via ORAL
  Filled 2013-06-15: qty 2

## 2013-06-15 MED ORDER — HYDROMORPHONE HCL PF 1 MG/ML IJ SOLN
1.0000 mg | Freq: Once | INTRAMUSCULAR | Status: AC
  Administered 2013-06-15: 1 mg via INTRAMUSCULAR
  Filled 2013-06-15: qty 1
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
## 2013-06-15 NOTE — ED Notes (Signed)
PA at bedside to update patient and family
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
## 2013-06-16 MED ORDER — IBUPROFEN 600 MG PO TABS: 600.0000 mg | ORAL_TABLET | Freq: Four times a day (QID) | ORAL | Status: DC | PRN

## 2013-06-16 MED ORDER — OXYCODONE HCL 5 MG PO TABS: 5.0000 mg | ORAL_TABLET | Freq: Four times a day (QID) | ORAL | Status: DC | PRN

## 2013-06-16 NOTE — ED Notes (Signed)
PA at bedside to go over discharge instructions and reasons for return, pt and family verbalize understanding

## 2013-06-16 NOTE — ED Notes (Signed)
Pt ambulated in hallway with stand by assistance, tolerated mostly well, began to feel nauseous during walking but was able to walk with little assistance

## 2013-06-16 NOTE — ED Provider Notes (Signed)
Medical screening examination/treatment/procedure(s) were performed by non-physician practitioner and as supervising physician I was immediately available for consultation/collaboration.  Jaymon Dudek L Treysen Sudbeck, MD 06/16/13 0038 
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
## 2013-06-20 ENCOUNTER — Ambulatory Visit: Payer: BC Managed Care – PPO | Admitting: Internal Medicine

## 2013-08-02 ENCOUNTER — Other Ambulatory Visit: Payer: Self-pay | Admitting: Orthopedic Surgery

## 2013-08-02 DIAGNOSIS — M542 Cervicalgia: Secondary | ICD-10-CM

## 2013-08-11 ENCOUNTER — Ambulatory Visit
Admission: RE | Admit: 2013-08-11 | Discharge: 2013-08-11 | Disposition: A | Discharge status: Home / Self Care | Payer: BC Managed Care – PPO | Source: Ambulatory Visit | Attending: Orthopedic Surgery | Admitting: Orthopedic Surgery

## 2013-08-11 DIAGNOSIS — M542 Cervicalgia: Secondary | ICD-10-CM

## 2013-08-24 ENCOUNTER — Telehealth: Payer: Self-pay | Admitting: Internal Medicine

## 2013-08-24 NOTE — Telephone Encounter (Signed)
[facility], sent to Dr. Jonny RuizJohn. 08/24/13/ss.

## 2013-09-22 ENCOUNTER — Telehealth: Payer: Self-pay | Admitting: Internal Medicine

## 2013-09-22 NOTE — Telephone Encounter (Signed)
[facility]'d [facility] forward 2 pages to Dr. Jonny RuizJohn

## 2013-09-27 ENCOUNTER — Encounter: Payer: Self-pay | Admitting: Internal Medicine

## 2013-09-27 ENCOUNTER — Other Ambulatory Visit (INDEPENDENT_AMBULATORY_CARE_PROVIDER_SITE_OTHER): Payer: BC Managed Care – PPO

## 2013-09-27 ENCOUNTER — Telehealth: Payer: Self-pay | Admitting: Internal Medicine

## 2013-09-27 ENCOUNTER — Other Ambulatory Visit: Payer: Self-pay

## 2013-09-27 ENCOUNTER — Other Ambulatory Visit: Payer: Self-pay | Admitting: Internal Medicine

## 2013-09-27 ENCOUNTER — Ambulatory Visit (INDEPENDENT_AMBULATORY_CARE_PROVIDER_SITE_OTHER): Payer: BC Managed Care – PPO | Admitting: Internal Medicine

## 2013-09-27 VITALS — BP 120/82 | HR 93 | Temp 98.2°F

## 2013-09-27 DIAGNOSIS — Z0000 Encounter for general adult medical examination without abnormal findings: Secondary | ICD-10-CM

## 2013-09-27 LAB — URINALYSIS, ROUTINE W REFLEX MICROSCOPIC
BILIRUBIN URINE: NEGATIVE
HGB URINE DIPSTICK: NEGATIVE
Ketones, ur: NEGATIVE
Leukocytes, UA: NEGATIVE
NITRITE: NEGATIVE
RBC / HPF: NONE SEEN (ref 0–?)
Specific Gravity, Urine: 1.02 (ref 1.000–1.030)
TOTAL PROTEIN, URINE-UPE24: NEGATIVE
UROBILINOGEN UA: 0.2 (ref 0.0–1.0)
Urine Glucose: NEGATIVE
WBC, UA: NONE SEEN (ref 0–?)
pH: 6 (ref 5.0–8.0)

## 2013-09-27 LAB — CBC WITH DIFFERENTIAL/PLATELET
BASOS ABS: 0 10*3/uL (ref 0.0–0.1)
BASOS PCT: 0.3 % (ref 0.0–3.0)
EOS ABS: 0 10*3/uL (ref 0.0–0.7)
Eosinophils Relative: 0 % (ref 0.0–5.0)
HCT: 38.8 % (ref 36.0–46.0)
Hemoglobin: 12.8 g/dL (ref 12.0–15.0)
LYMPHS PCT: 42.6 % (ref 12.0–46.0)
Lymphs Abs: 2 10*3/uL (ref 0.7–4.0)
MCHC: 33.1 g/dL (ref 30.0–36.0)
MCV: 95.5 fl (ref 78.0–100.0)
MONO ABS: 0.5 10*3/uL (ref 0.1–1.0)
Monocytes Relative: 11.1 % (ref 3.0–12.0)
NEUTROS PCT: 46 % (ref 43.0–77.0)
Neutro Abs: 2.1 10*3/uL (ref 1.4–7.7)
PLATELETS: 197 10*3/uL (ref 150.0–400.0)
RBC: 4.06 Mil/uL (ref 3.87–5.11)
RDW: 14.7 % (ref 11.5–15.5)
WBC: 4.6 10*3/uL (ref 4.0–10.5)

## 2013-09-27 LAB — BASIC METABOLIC PANEL
BUN: 13 mg/dL (ref 6–23)
CHLORIDE: 109 meq/L (ref 96–112)
CO2: 26 meq/L (ref 19–32)
CREATININE: 0.8 mg/dL (ref 0.4–1.2)
Calcium: 9.1 mg/dL (ref 8.4–10.5)
GFR: 100.28 mL/min (ref >60.00)
Glucose, Bld: 83 mg/dL (ref 70–99)
POTASSIUM: 3.7 meq/L (ref 3.5–5.1)
Sodium: 140 mEq/L (ref 135–145)

## 2013-09-27 LAB — LIPID PANEL
Cholesterol: 211 mg/dL — ABNORMAL HIGH (ref 0–200)
HDL: 53.5 mg/dL (ref >39.00)
LDL Cholesterol: 135 mg/dL — ABNORMAL HIGH (ref 0–99)
NonHDL: 157.5
Total CHOL/HDL Ratio: 4
Triglycerides: 115 mg/dL (ref 0.0–149.0)
VLDL: 23 mg/dL (ref 0.0–40.0)

## 2013-09-27 LAB — HEPATIC FUNCTION PANEL
ALK PHOS: 130 U/L — AB (ref 39–117)
ALT: 25 U/L (ref 0–35)
AST: 25 U/L (ref 0–37)
Albumin: 3.9 g/dL (ref 3.5–5.2)
BILIRUBIN TOTAL: 0.4 mg/dL (ref 0.2–1.2)
Bilirubin, Direct: 0 mg/dL (ref 0.0–0.3)
TOTAL PROTEIN: 7.4 g/dL (ref 6.0–8.3)

## 2013-09-27 LAB — TSH: TSH: 1.29 u[IU]/mL (ref 0.35–4.50)

## 2013-09-27 MED ORDER — ATORVASTATIN CALCIUM 10 MG PO TABS: 10.0000 mg | ORAL_TABLET | Freq: Every day | ORAL | Status: DC

## 2013-09-27 MED ORDER — ASPIRIN EC 81 MG PO TBEC: 81.0000 mg | DELAYED_RELEASE_TABLET | Freq: Every day | ORAL | Status: DC

## 2013-09-27 NOTE — Telephone Encounter (Signed)
Patient returned your phone call.

## 2013-09-27 NOTE — Progress Notes (Signed)
Subjective:    Patient ID: Deborah Pratt, female    DOB: 11/29/1955, 58 y.o.   MRN: [id number]  HPI  Here for wellness and f/u;  Overall doing ok;  Pt denies CP, worsening SOB, DOE, wheezing, orthopnea, PND, worsening LE edema, palpitations, dizziness or syncope.  Pt denies neurological change such as new headache, facial or extremity weakness.  Pt denies polydipsia, polyuria, or low sugar symptoms. Pt states overall good compliance with treatment and medications, good tolerability, and has been trying to follow lower cholesterol diet.  Pt denies worsening depressive symptoms, suicidal ideation or panic. No fever, night sweats, wt loss, loss of appetite, or other constitutional symptoms.  Pt states good ability with ADL's, has low fall risk, home safety reviewed and adequate, no other significant changes in hearing or vision, and only occasionally active with exercise.  Not back to work yet, due to return fri aug 14.  Wants to defer mamogram for now , as she has lost all her sick time and difficult to sched off work for now. Past Medical History  Diagnosis Date  . HYPERTENSION 11/08/2006    Qualifier: Diagnosis of  By: Maris BergerSherwood, Elizabeth Ann   . CHOLELITHIASIS 11/08/2006    Qualifier: Diagnosis of  By: Maris BergerSherwood, Elizabeth Ann   . Generalized headaches   . UTI (lower urinary tract infection)   . Kidney stones   . Allergy   . ANXIETY 11/11/2006    Qualifier: Diagnosis of  By: Jonny RuizJohn MD, Len BlalockJames W   . Asthma   . DEPRESSION 11/11/2006    Qualifier: Diagnosis of  By: Jonny RuizJohn MD, Len BlalockJames W   . Pulmonary embolism 58 yo     on BCP   Past Surgical History  Procedure Laterality Date  . Tubal ligation    . Abdominal hysterectomy      reports that she has been smoking.  She has never used smokeless tobacco. She reports that she drinks alcohol. She reports that she does not use illicit drugs. family history includes Arthritis in her other and other; Gout in her father; Heart disease in her mother  and other; Hypertension in her other; Stroke in her other. Allergies  Allergen Reactions  . Acetaminophen     rash  . Latex     rash  . Morphine     rash  . Penicillins     unknown   Current Outpatient Prescriptions on File Prior to Visit  Medication Sig Dispense Refill  . ibuprofen (ADVIL,MOTRIN) 600 MG tablet Take 1 tablet (600 mg total) by mouth every 6 (six) hours as needed.  30 tablet  0   No current facility-administered medications on file prior to visit.     Review of Systems Constitutional: Negative for increased diaphoresis, other activity, appetite or other siginficant weight change  HENT: Negative for worsening hearing loss, ear pain, facial swelling, mouth sores and neck stiffness.   Eyes: Negative for other worsening pain, redness or visual disturbance.  Respiratory: Negative for shortness of breath and wheezing.   Cardiovascular: Negative for chest pain and palpitations.  Gastrointestinal: Negative for diarrhea, blood in stool, abdominal distention or other pain Genitourinary: Negative for hematuria, flank pain or change in urine volume.  Musculoskeletal: Negative for myalgias or other joint complaints.  Skin: Negative for color change and wound.  Neurological: Negative for syncope and numbness. other than noted Hematological: Negative for adenopathy. or other swelling Psychiatric/Behavioral: Negative for hallucinations, self-injury, decreased concentration or other worsening agitation.  Objective:   Physical Exam BP 120/82  Pulse 93  Temp(Src) 98.2 F (36.8 C) VS noted,  Constitutional: Pt is oriented to person, place, and time. Appears well-developed and well-nourished.  Head: Normocephalic and atraumatic.  Right Ear: External ear normal.  Left Ear: External ear normal.  Nose: Nose normal.  Mouth/Throat: Oropharynx is clear and moist.  Eyes: Conjunctivae and EOM are normal. Pupils are equal, round, and reactive to light.  Neck: Normal range of  motion. Neck supple. No JVD present. No tracheal deviation present.  Cardiovascular: Normal rate, regular rhythm, normal heart sounds and intact distal pulses.   Pulmonary/Chest: Effort normal and breath sounds without rales or wheezing  Abdominal: Soft. Bowel sounds are normal. NT. No HSM  Musculoskeletal: Normal range of motion. Exhibits no edema.  Lymphadenopathy:  Has no cervical adenopathy.  Neurological: Pt is alert and oriented to person, place, and time. Pt has normal reflexes. No cranial nerve deficit. Motor grossly intact Skin: Skin is warm and dry. No rash noted.  Psychiatric:  Has normal mood and affect. Behavior is normal.     Assessment & Plan:

## 2013-09-27 NOTE — Patient Instructions (Signed)

## 2013-09-27 NOTE — Progress Notes (Signed)
Pre visit review using our clinic review tool, if applicable. No additional management support is needed unless otherwise documented below in the visit note. 

## 2013-09-27 NOTE — Telephone Encounter (Signed)
Called the patient back informed of her lab results.

## 2013-10-01 NOTE — Assessment & Plan Note (Signed)

## 2014-07-26 ENCOUNTER — Ambulatory Visit (INDEPENDENT_AMBULATORY_CARE_PROVIDER_SITE_OTHER): Payer: BLUE CROSS/BLUE SHIELD | Admitting: Internal Medicine

## 2014-07-26 ENCOUNTER — Encounter: Payer: Self-pay | Admitting: Internal Medicine

## 2014-07-26 VITALS — BP 118/78 | HR 73 | Temp 97.8°F | Wt 181.1 lb

## 2014-07-26 DIAGNOSIS — F32A Depression, unspecified: Secondary | ICD-10-CM

## 2014-07-26 DIAGNOSIS — G4710 Hypersomnia, unspecified: Secondary | ICD-10-CM

## 2014-07-26 DIAGNOSIS — F329 Major depressive disorder, single episode, unspecified: Secondary | ICD-10-CM | POA: Diagnosis not present

## 2014-07-26 DIAGNOSIS — R400 Somnolence: Secondary | ICD-10-CM | POA: Insufficient documentation

## 2014-07-26 DIAGNOSIS — R0683 Snoring: Secondary | ICD-10-CM

## 2014-07-26 DIAGNOSIS — I10 Essential (primary) hypertension: Secondary | ICD-10-CM

## 2014-07-26 NOTE — Progress Notes (Signed)
Pre visit review using our clinic review tool, if applicable. No additional management support is needed unless otherwise documented below in the visit note. 

## 2014-07-26 NOTE — Progress Notes (Signed)
Subjective:    Patient ID: Deborah Pratt, female    DOB: 05-13-1955, 59 y.o.   MRN: [id number]  HPI  Here to f/u -   C/o snoring and daytime fatigiue, rather severe it seems as husband keeps waking up, then wakes her up , despite sleep number bed, and s/p nasal surgury, could take a nap anytime of the day with ongoing fatigue. 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 Wt Readings from Last 3 Encounters:  07/26/14 181 lb 1.3 oz (82.137 kg)  06/15/13 162 lb (73.483 kg)  03/03/13 170 lb 12.8 oz (77.474 kg)  Gained 10 lbs recently, Quit smoking Feb 15, 2014.  Trying to go to GYM and more exercise.  No significant eye or hasal allregy symtpoms recently. Pt denies chest pain, increased sob or doe, wheezing, orthopnea, PND, increased LE swelling, palpitations, dizziness or syncope.  Denies worsening depressive symptoms, suicidal ideation, or panic Past Medical History  Diagnosis Date  . HYPERTENSION 11/08/2006    Qualifier: Diagnosis of  By: Maris Berger   . CHOLELITHIASIS 11/08/2006    Qualifier: Diagnosis of  By: Maris Berger   . Generalized headaches   . UTI (lower urinary tract infection)   . Kidney stones   . Allergy   . ANXIETY 11/11/2006    Qualifier: Diagnosis of  By: Jonny Ruiz MD, Len Blalock   . Asthma   . DEPRESSION 11/11/2006    Qualifier: Diagnosis of  By: Jonny Ruiz MD, Len Blalock   . Pulmonary embolism 59 yo     on BCP   Past Surgical History  Procedure Laterality Date  . Tubal ligation    . Abdominal hysterectomy      reports that she quit smoking about 5 months ago. She has never used smokeless tobacco. She reports that she drinks alcohol. She reports that she does not use illicit drugs. family history includes Arthritis in her other and other; Gout in her father; Heart disease in her mother and other; Hypertension in her other; Stroke in her other. Allergies   Allergen Reactions  . Acetaminophen     rash  . Latex     rash  . Morphine     rash  . Penicillins     unknown   Current Outpatient Prescriptions on File Prior to Visit  Medication Sig Dispense Refill  . ibuprofen (ADVIL,MOTRIN) 600 MG tablet Take 1 tablet (600 mg total) by mouth every 6 (six) hours as needed. 30 tablet 0  . aspirin EC 81 MG tablet Take 1 tablet (81 mg total) by mouth daily. (Patient not taking: Reported on 07/26/2014) 90 tablet 11  . atorvastatin (LIPITOR) 10 MG tablet Take 1 tablet (10 mg total) by mouth daily. (Patient not taking: Reported on 07/26/2014) 90 tablet 3   No current facility-administered medications on file prior to visit.    Review of Systems  Constitutional: Negative for unusual diaphoresis or night sweats HENT: Negative for ringing in ear or discharge Eyes: Negative for double vision or worsening visual disturbance.  Respiratory: Negative for choking and stridor.   Gastrointestinal: Negative for vomiting or other signifcant bowel change Genitourinary: Negative for hematuria or change in urine volume.  Musculoskeletal: Negative for other MSK pain or swelling Skin: Negative for color change and worsening wound.  Neurological: Negative for tremors and numbness other than noted  Psychiatric/Behavioral: Negative for decreased concentration  or agitation other than above       Objective:   Physical Exam BP 118/78 mmHg  Pulse 73  Temp(Src) 97.8 F (36.6 C) (Oral)  Wt 181 lb 1.3 oz (82.137 kg)  SpO2 98% VS noted,  Constitutional: Pt appears in no significant distress HENT: Head: NCAT.  Right Ear: External ear normal.  Left Ear: External ear normal.  Eyes: . Pupils are equal, round, and reactive to light. Conjunctivae and EOM are normal Neck: Normal range of motion. Neck supple.  Cardiovascular: Normal rate and regular rhythm.   Pulmonary/Chest: Effort normal and breath sounds without rales or wheezing.  Abd:  Soft, NT, ND, + BS Neurological:  Pt is alert. Not confused , motor grossly intact Skin: Skin is warm. No rash, no LE edema Psychiatric: Pt behavior is normal. No agitation.     Assessment & Plan:

## 2014-07-26 NOTE — Patient Instructions (Addendum)
Please continue all other medications as before, and refills have been done if requested.  Please have the pharmacy call with any other refills you may need.  Please continue your efforts at being more active, low cholesterol diet, and weight control.  You are otherwise up to date with prevention measures today.  Please keep your appointments with your specialists as you may have planned   

## 2014-07-27 ENCOUNTER — Encounter: Payer: Self-pay | Admitting: Internal Medicine

## 2014-07-27 NOTE — Assessment & Plan Note (Signed)
Also for re-start allergy meds,  to f/u any worsening symptoms or concerns

## 2014-07-27 NOTE — Assessment & Plan Note (Signed)
stable overall by history and exam, recent data reviewed with pt, and pt to continue medical treatment as before,  to f/u any worsening symptoms or concerns Lab Results  Component Value Date   WBC 4.6 09/27/2013   HGB 12.8 09/27/2013   HCT 38.8 09/27/2013   PLT 197.0 09/27/2013   GLUCOSE 83 09/27/2013   CHOL 211* 09/27/2013   TRIG 115.0 09/27/2013   HDL 53.50 09/27/2013   LDLDIRECT 134.9 09/22/2012   LDLCALC 135* 09/27/2013   ALT 25 09/27/2013   AST 25 09/27/2013   NA 140 09/27/2013   K 3.7 09/27/2013   CL 109 09/27/2013   CREATININE 0.8 09/27/2013   BUN 13 09/27/2013   CO2 26 09/27/2013   TSH 1.29 09/27/2013

## 2014-07-27 NOTE — Assessment & Plan Note (Signed)
?   Sleep apnea, for pulm referral for further eval and tx

## 2014-07-27 NOTE — Assessment & Plan Note (Signed)
stable overall by history and exam, recent data reviewed with pt, and pt to continue medical treatment as before,  to f/u any worsening symptoms or concerns BP Readings from Last 3 Encounters:  07/26/14 118/78  09/27/13 120/82  06/16/13 135/59

## 2014-08-02 ENCOUNTER — Ambulatory Visit (INDEPENDENT_AMBULATORY_CARE_PROVIDER_SITE_OTHER): Payer: BLUE CROSS/BLUE SHIELD | Admitting: Internal Medicine

## 2014-08-02 ENCOUNTER — Encounter: Payer: Self-pay | Admitting: Internal Medicine

## 2014-08-02 VITALS — BP 118/82 | HR 77 | Ht 69.0 in | Wt 181.0 lb

## 2014-08-02 DIAGNOSIS — G4733 Obstructive sleep apnea (adult) (pediatric): Secondary | ICD-10-CM

## 2014-08-02 NOTE — Patient Instructions (Addendum)
Order- schedule split protocol NPSG       Dx OSA  Please call as needed 

## 2014-08-02 NOTE — Progress Notes (Signed)
Subjective:    Patient ID: Deborah Pratt, female    DOB: September 08, 1955, 59 y.o.   MRN: [id number]  HPI 43 yoF former smoker  Follows For: Referred by Dr. Jonny Ruiz for snoring. Pt states husband complains of snoring.   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 ENT-history of septoplasty made little improvement in her snoring. Denies active heart or lung disease. Remote history of pulmonary embolism over 30 years ago. Quit smoking 02/15/2014. Tennis player.  Prior to Admission medications   Medication Sig Start Date End Date Taking? Authorizing Provider  ibuprofen (ADVIL,MOTRIN) 600 MG tablet Take 1 tablet (600 mg total) by mouth every 6 (six) hours as needed. 06/16/13  Yes Roxy Horseman, PA-C  aspirin EC 81 MG tablet Take 1 tablet (81 mg total) by mouth daily. Patient not taking: Reported on 08/02/2014 09/27/13   Corwin Levins, MD  atorvastatin (LIPITOR) 10 MG tablet Take 1 tablet (10 mg total) by mouth daily. Patient not taking: Reported on 07/26/2014 09/27/13   Corwin Levins, MD   Past Medical History  Diagnosis Date  . HYPERTENSION 11/08/2006    Qualifier: Diagnosis of  By: Maris Berger   . CHOLELITHIASIS 11/08/2006    Qualifier: Diagnosis of  By: Maris Berger   . Generalized headaches   . UTI (lower urinary tract infection)   . Kidney stones   . Allergy   . ANXIETY 11/11/2006    Qualifier: Diagnosis of  By: Jonny Ruiz MD, Len Blalock   . Asthma   . DEPRESSION 11/11/2006    Qualifier: Diagnosis of  By: Jonny Ruiz MD, Len Blalock   . Pulmonary embolism 59 yo     on BCP   Past Surgical History  Procedure Laterality Date  . Tubal ligation    . Abdominal hysterectomy    . Colonoscopy    . Nasal sinus surgery     Family History  Problem Relation Age of Onset  . Heart disease Mother   . Gout Father   . Arthritis Other   . Heart disease Other   . Stroke Other     . Hypertension Other   . Arthritis Other    History   Social History  . Marital Status: Married    Spouse Name: N/A  . Number of Children: N/A  . Years of Education: 12   Occupational History  . Engineer, mining    Social History Main Topics  . Smoking status: Former Smoker    Quit date: 02/15/2014  . Smokeless tobacco: Never Used  . Alcohol Use: 0.0 oz/week    0 Standard drinks or equivalent per week  . Drug Use: No  . Sexual Activity: Not on file   Other Topics Concern  . Not on file   Social History Narrative   Review of Systems  Constitutional: Negative for fever and unexpected weight change.  HENT: Negative for congestion, dental problem, ear pain, nosebleeds, postnasal drip, rhinorrhea, sinus pressure and sneezing.   Eyes: Negative for redness and itching.  Respiratory: Negative for cough, chest tightness, shortness of breath and wheezing.   Cardiovascular: Positive for leg swelling. Negative for palpitations.  Gastrointestinal: Negative for nausea and vomiting.  Genitourinary: Negative for dysuria.  Musculoskeletal: Negative for joint swelling.  Skin: Negative for rash.  Neurological: Negative for headaches.  Hematological: Bruises/bleeds easily.      Objective:  Physical Exam OBJ- Physical Exam General- Alert, Oriented, Affect-appropriate, Distress- none acute Skin- rash-none, lesions- none, excoriation- none Lymphadenopathy- none Head- atraumatic            Eyes- Gross vision intact, PERRLA, conjunctivae and secretions clear            Ears- Hearing, canals-normal            Nose- Clear, no-Septal dev, mucus, polyps, erosion, perforation             Throat- Mallampati III-IV , mucosa clear , drainage- none, tonsils- atrophic Neck- flexible , trachea midline, no stridor , thyroid nl, carotid no bruit Chest - symmetrical excursion , unlabored           Heart/CV- RRR , no murmur , no gallop  , no rub, nl s1 s2                           - JVD- none , edema-  none, stasis changes- none, varices- none           Lung- clear to P&A, wheeze- none, cough- none , dullness-none, rub- none           Chest wall-  Abd-  Br/ Gen/ Rectal- Not done, not indicated Extrem- cyanosis- none, clubbing, none, atrophy- none, strength- nl Neuro- grossly intact to observation         Assessment & Plan:

## 2014-08-05 DIAGNOSIS — G4733 Obstructive sleep apnea (adult) (pediatric): Secondary | ICD-10-CM | POA: Insufficient documentation

## 2014-08-05 NOTE — Assessment & Plan Note (Signed)
History and long palate on physical examination are consistent with obstructive sleep apnea. We discussed options, medical concerns of sleep apnea and her responsibility to drive safely. Plan-schedule sleep study

## 2014-09-01 ENCOUNTER — Encounter: Payer: Self-pay | Admitting: Physician Assistant

## 2014-09-01 ENCOUNTER — Ambulatory Visit (INDEPENDENT_AMBULATORY_CARE_PROVIDER_SITE_OTHER): Payer: BLUE CROSS/BLUE SHIELD | Admitting: Physician Assistant

## 2014-09-01 VITALS — BP 138/84 | HR 125 | Temp 97.4°F | Resp 22

## 2014-09-01 DIAGNOSIS — L509 Urticaria, unspecified: Secondary | ICD-10-CM | POA: Diagnosis not present

## 2014-09-01 DIAGNOSIS — L299 Pruritus, unspecified: Secondary | ICD-10-CM

## 2014-09-01 MED ORDER — DIPHENHYDRAMINE HCL 50 MG/ML IJ SOLN
50.0000 mg | Freq: Once | INTRAMUSCULAR | Status: AC
Start: 2014-09-01 — End: 2014-09-01
  Administered 2014-09-01: 50 mg via INTRAMUSCULAR

## 2014-09-01 NOTE — Patient Instructions (Signed)
Take a dose of Claritin or Allegra this evening, and each day for the next several days. Taking a dose of ranitidine (Zantac) twice daily can also help for the next several days. Stay cool and dry as best you can. I recommend that you not use the Hydroxycut, as it may have caused your symptoms.

## 2014-09-01 NOTE — Progress Notes (Signed)
Patient ID: Deborah Pratt, female    DOB: May 12, 1955, 59 y.o.   MRN: [id number]  PCP: Oliver Barre, MD  Subjective:   Chief Complaint  Patient presents with  . Allergic Reaction    itching, rash    HPI Brought back and seen urgently reporting an allergic reaction. She is accompanied by her husband.  Noticed a red itchy place on the back of her neck about 90 minutes ago as she arrived at the gym to work out. During her work-out she went to the bathroom and noticed many more red bumps all over, including her face.  She then developed itching and burning and called her husband to come get her. At home she took a cold shower, which has helped before, but this time it didn't. She took 2 OTC Benadryl tablets and came here.  She denies any symptoms involving the tongue or mouth, no itchiness of the throat, no SOB or chest tightness, no cough.  Took Hydroxycut this morning about 10 am. She's been taking this about a week. Took ibuprofen about 12:45 pm. She's taken this many times before.  She's had itchy rash like this before, but it's never been this bad. The last time was about 8 months ago. She has food allergies. She allergic to just about everything. She usually takes Benadryl and it subsides in about 15-20 minutes, and then she goes to sleep, because she just took the benadryl.  She had a food reaction in 2004 after eating seafood, and had throat closing sensation. Otherwise, all her symptoms have been hives and itching of the skin.    Review of Systems As above.    Patient Active Problem List   Diagnosis Date Noted  . Obstructive sleep apnea 08/05/2014  . Daytime somnolence 07/26/2014  . Snoring 03/03/2012  . Kidney stones   . Allergic rhinitis, cause unspecified   . Asthma   . Pulmonary embolism   . Preventative health care 08/27/2011  . HYPERSOMNIA 04/16/2008  . ANXIETY 11/11/2006  . Depression 11/11/2006  . Essential hypertension 11/08/2006  .  CHOLELITHIASIS 11/08/2006  . Headache(784.0) 11/08/2006     Prior to Admission medications   Medication Sig Start Date End Date Taking? Authorizing Provider  aspirin EC 81 MG tablet Take 1 tablet (81 mg total) by mouth daily. Patient not taking: Reported on 08/02/2014 09/27/13   Corwin Levins, MD  atorvastatin (LIPITOR) 10 MG tablet Take 1 tablet (10 mg total) by mouth daily. Patient not taking: Reported on 07/26/2014 09/27/13   Corwin Levins, MD  ibuprofen (ADVIL,MOTRIN) 600 MG tablet Take 1 tablet (600 mg total) by mouth every 6 (six) hours as needed. 06/16/13   Roxy Horseman, PA-C     Allergies  Allergen Reactions  . Acetaminophen     rash  . Latex     rash  . Morphine     rash  . Penicillins     unknown       Objective:  Physical Exam  Constitutional: She is oriented to person, place, and time. Vital signs are normal. She appears well-developed and well-nourished. She is active and cooperative. No distress.  BP 138/84 mmHg  Pulse 125  Temp(Src) 97.4 F (36.3 C) (Oral)  Resp 22  SpO2 98%  HENT:  Head: Normocephalic and atraumatic.  Right Ear: Hearing normal.  Left Ear: Hearing normal.  Eyes: Conjunctivae are normal. No scleral icterus.  Neck: Normal range of motion. Neck supple. No thyromegaly present.  Cardiovascular: Normal  rate, regular rhythm and normal heart sounds.   Pulses:      Radial pulses are 2+ on the right side, and 2+ on the left side.  Pulmonary/Chest: Effort normal and breath sounds normal.  Lymphadenopathy:       Head (right side): No tonsillar, no preauricular, no posterior auricular and no occipital adenopathy present.       Head (left side): No tonsillar, no preauricular, no posterior auricular and no occipital adenopathy present.    She has no cervical adenopathy.       Right: No supraclavicular adenopathy present.       Left: No supraclavicular adenopathy present.  Neurological: She is alert and oriented to person, place, and time. No sensory  deficit.  Skin: Skin is warm, dry and intact. Rash noted. Rash is urticarial (diffuse, lesions <1 cm). No cyanosis or erythema. Nails show no clubbing.  Psychiatric: She has a normal mood and affect. Her speech is normal and behavior is normal.   50 mg Benadryl IM given. She had compete resolution of her symptoms during observation over 60 minutes.        Assessment & Plan:   1. Hives 2. Itching Resolved. OTC oral antihistamine and H2 blocker. Consider allergy testing to help identify triggers. Advise stopping the Hydroxy-Cut. - diphenhydrAMINE (BENADRYL) injection 50 mg; Inject 1 mL (50 mg total) into the muscle once.    Fernande Brashelle S. Gaile Allmon, PA-C Physician Assistant-Certified [facility]

## 2014-09-04 ENCOUNTER — Ambulatory Visit (HOSPITAL_BASED_OUTPATIENT_CLINIC_OR_DEPARTMENT_OTHER): Payer: BLUE CROSS/BLUE SHIELD | Attending: Internal Medicine | Admitting: Radiology

## 2014-09-04 DIAGNOSIS — G4733 Obstructive sleep apnea (adult) (pediatric): Secondary | ICD-10-CM | POA: Diagnosis present

## 2014-09-04 DIAGNOSIS — R0683 Snoring: Secondary | ICD-10-CM | POA: Insufficient documentation

## 2014-09-22 DIAGNOSIS — G4733 Obstructive sleep apnea (adult) (pediatric): Secondary | ICD-10-CM | POA: Diagnosis not present

## 2014-09-22 NOTE — Progress Notes (Signed)
   Patient Name: Deborah Pratt, Deborah Pratt Date: 09/04/2014 Gender: Female D.O.B: 29-Mar-1955 Age (years): 48 Referring Provider: C. Young, MD Height (inches): 69 Interpreting Physician: Jetty Duhamel MD, ABSM Weight (lbs): 178 RPSGT: Shelah Lewandowsky BMI: 26 MRN: [id number] Neck Size: 13.50 CLINICAL INFORMATION Sleep Study Type: NPSG  Indication for sleep study: Fatigue, Morning Headaches, OSA, Snoring  Epworth Sleepiness Score: 8  SLEEP STUDY TECHNIQUE As per the AASM Manual for the Scoring of Sleep and Associated Events v2.3 (April 2016) with a hypopnea requiring 4% desaturations.  The channels recorded and monitored were frontal, central and occipital EEG, electrooculogram (EOG), submentalis EMG (chin), nasal and oral airflow, thoracic and abdominal wall motion, anterior tibialis EMG, snore microphone, electrocardiogram, and pulse oximetry.  MEDICATIONS Patient's medications include: Charted for review. Medications self-administered by patient during sleep study : No sleep medicine administered.  SLEEP ARCHITECTURE The study was initiated at 10:18:48 PM and ended at 4:34:04 AM.  Sleep onset time was 10.6 minutes and the sleep efficiency was 88.5%. The total sleep time was 332.0 minutes.  Stage REM latency was 79.5 minutes.  The patient spent 6.63% of the night in stage N1 sleep, 56.02% in stage N2 sleep, 15.06% in stage N3 and 22.29% in REM.  Alpha intrusion was absent.  Supine sleep was 5.42%.  Awake after sleep onset 32.7 minutes  RESPIRATORY PARAMETERS The overall apnea/hypopnea index (AHI) was 1.8 per hour. There were 1 total apneas, including 1 obstructive, 0 central and 0 mixed apneas. There were 9 hypopneas and 43 RERAs.  The AHI during Stage REM sleep was 8.1 per hour.  AHI while supine was 0.0 per hour.  The mean oxygen saturation was 96.21%. The minimum SpO2 during sleep was 90.00%.  Loud snoring was noted during this study.  Awake after  sleep onset 32.7 minutes  CARDIAC DATA The 2 lead EKG demonstrated sinus rhythm. The mean heart rate was 85.50 beats per minute. Other EKG findings include: None.  LEG MOVEMENT DATA The total PLMS were 0 with a resulting PLMS index of 0.00. Associated arousal with leg movement index was 0.0 .  IMPRESSIONS No significant obstructive sleep apnea occurred during this study (AHI = 1.8/h). No significant central sleep apnea occurred during this study (CAI = 0.0/h). The patient had minimal or no oxygen desaturation during the study (Min O2 = 90.00%) The patient snored with Loud snoring volume. No cardiac abnormalities were noted during this study. Clinically significant periodic limb movements did not occur during sleep. No significant associated arousals.   DIAGNOSIS Normal study  RECOMMENDATIONS Avoid alcohol, sedatives and other CNS depressants that may worsen sleep apnea and disrupt normal sleep architecture. Sleep hygiene should be reviewed to assess factors that may improve sleep quality. Weight management and regular exercise should be initiated or continued if appropriate.  Waymon Budge Diplomate, American Board of Sleep Medicine  ELECTRONICALLY SIGNED ON:  09/22/2014, 9:59 AM [facility] PH: [phone]   FX: [phone] ACCREDITED BY THE AMERICAN ACADEMY OF SLEEP MEDICINE

## 2014-10-03 ENCOUNTER — Encounter: Payer: Self-pay | Admitting: Internal Medicine

## 2014-10-03 ENCOUNTER — Ambulatory Visit (INDEPENDENT_AMBULATORY_CARE_PROVIDER_SITE_OTHER): Payer: BLUE CROSS/BLUE SHIELD | Admitting: Internal Medicine

## 2014-10-03 ENCOUNTER — Other Ambulatory Visit (INDEPENDENT_AMBULATORY_CARE_PROVIDER_SITE_OTHER): Payer: BLUE CROSS/BLUE SHIELD

## 2014-10-03 VITALS — BP 122/76 | HR 84 | Temp 97.8°F | Ht 69.0 in | Wt 181.0 lb

## 2014-10-03 DIAGNOSIS — Z0000 Encounter for general adult medical examination without abnormal findings: Secondary | ICD-10-CM | POA: Diagnosis not present

## 2014-10-03 DIAGNOSIS — E785 Hyperlipidemia, unspecified: Secondary | ICD-10-CM

## 2014-10-03 HISTORY — DX: Hyperlipidemia, unspecified: E78.5

## 2014-10-03 LAB — CBC WITH DIFFERENTIAL/PLATELET
BASOS ABS: 0 10*3/uL (ref 0.0–0.1)
Basophils Relative: 0.3 % (ref 0.0–3.0)
Eosinophils Absolute: 0.2 10*3/uL (ref 0.0–0.7)
Eosinophils Relative: 3.9 % (ref 0.0–5.0)
HEMATOCRIT: 39.3 % (ref 36.0–46.0)
Hemoglobin: 13 g/dL (ref 12.0–15.0)
LYMPHS PCT: 49 % — AB (ref 12.0–46.0)
Lymphs Abs: 2.1 10*3/uL (ref 0.7–4.0)
MCHC: 33.2 g/dL (ref 30.0–36.0)
MCV: 92.5 fl (ref 78.0–100.0)
MONOS PCT: 13.6 % — AB (ref 3.0–12.0)
Monocytes Absolute: 0.6 10*3/uL (ref 0.1–1.0)
NEUTROS ABS: 1.4 10*3/uL (ref 1.4–7.7)
Neutrophils Relative %: 33.2 % — ABNORMAL LOW (ref 43.0–77.0)
PLATELETS: 202 10*3/uL (ref 150.0–400.0)
RBC: 4.24 Mil/uL (ref 3.87–5.11)
RDW: 13.3 % (ref 11.5–15.5)
WBC: 4.2 10*3/uL (ref 4.0–10.5)

## 2014-10-03 LAB — BASIC METABOLIC PANEL
BUN: 13 mg/dL (ref 6–23)
CALCIUM: 9.2 mg/dL (ref 8.4–10.5)
CO2: 25 meq/L (ref 19–32)
CREATININE: 0.89 mg/dL (ref 0.40–1.20)
Chloride: 106 mEq/L (ref 96–112)
GFR: 83.29 mL/min (ref >60.00)
Glucose, Bld: 111 mg/dL — ABNORMAL HIGH (ref 70–99)
Potassium: 4.1 mEq/L (ref 3.5–5.1)
Sodium: 139 mEq/L (ref 135–145)

## 2014-10-03 LAB — URINALYSIS, ROUTINE W REFLEX MICROSCOPIC
BILIRUBIN URINE: NEGATIVE
HGB URINE DIPSTICK: NEGATIVE
KETONES UR: NEGATIVE
LEUKOCYTES UA: NEGATIVE
Nitrite: NEGATIVE
RBC / HPF: NONE SEEN (ref 0–?)
Specific Gravity, Urine: 1.025 (ref 1.000–1.030)
Total Protein, Urine: NEGATIVE
URINE GLUCOSE: NEGATIVE
UROBILINOGEN UA: 0.2 (ref 0.0–1.0)
pH: 6 (ref 5.0–8.0)

## 2014-10-03 LAB — LIPID PANEL
CHOL/HDL RATIO: 4
Cholesterol: 179 mg/dL (ref 0–200)
HDL: 43.2 mg/dL (ref >39.00)
LDL Cholesterol: 113 mg/dL — ABNORMAL HIGH (ref 0–99)
NONHDL: 135.53
TRIGLYCERIDES: 114 mg/dL (ref 0.0–149.0)
VLDL: 22.8 mg/dL (ref 0.0–40.0)

## 2014-10-03 LAB — HEPATIC FUNCTION PANEL
ALT: 22 U/L (ref 0–35)
AST: 21 U/L (ref 0–37)
Albumin: 4 g/dL (ref 3.5–5.2)
Alkaline Phosphatase: 146 U/L — ABNORMAL HIGH (ref 39–117)
BILIRUBIN DIRECT: 0 mg/dL (ref 0.0–0.3)
BILIRUBIN TOTAL: 0.3 mg/dL (ref 0.2–1.2)
TOTAL PROTEIN: 7.2 g/dL (ref 6.0–8.3)

## 2014-10-03 LAB — TSH: TSH: 3.05 u[IU]/mL (ref 0.35–4.50)

## 2014-10-03 NOTE — Assessment & Plan Note (Signed)

## 2014-10-03 NOTE — Progress Notes (Signed)
Subjective:    Patient ID: Deborah Pratt, female    DOB: [birth year], 59 y.o.   MRN: [id number]  HPI  Here for wellness and f/u;  Overall doing ok;  Pt denies Chest pain, worsening SOB, DOE, wheezing, orthopnea, PND, worsening LE edema, palpitations, dizziness or syncope.  Pt denies neurological change such as new headache, facial or extremity weakness.  Pt denies polydipsia, polyuria, or low sugar symptoms. Pt states overall good compliance with treatment and medications, good tolerability, and has been trying to follow appropriate diet.  Pt denies worsening depressive symptoms, suicidal ideation or panic. No fever, night sweats, wt loss, loss of appetite, or other constitutional symptoms.  Pt states good ability with ADL's, has low fall risk, home safety reviewed and adequate, no other significant changes in hearing or vision, and near daily walking for exercise.  Quit smoking x 1 yr, only gained a few lbs Wt Readings from Last 3 Encounters:  10/03/14 181 lb (82.101 kg)  09/04/14 178 lb (80.74 kg)  08/02/14 181 lb (82.101 kg)   Past Medical History  Diagnosis Date  . HYPERTENSION 11/08/2006    Qualifier: Diagnosis of  By: Maris Berger   . CHOLELITHIASIS 11/08/2006    Qualifier: Diagnosis of  By: Maris Berger   . Generalized headaches   . UTI (lower urinary tract infection)   . Kidney stones   . Allergy   . ANXIETY 11/11/2006    Qualifier: Diagnosis of  By: Jonny Ruiz MD, Len Blalock   . Asthma   . DEPRESSION 11/11/2006    Qualifier: Diagnosis of  By: Jonny Ruiz MD, Len Blalock   . Pulmonary embolism 59 yo     on BCP   Past Surgical History  Procedure Laterality Date  . Tubal ligation    . Abdominal hysterectomy    . Colonoscopy    . Nasal sinus surgery      reports that she quit smoking about 7 months ago. She has never used smokeless tobacco. She reports that she drinks alcohol. She reports that she does not use illicit drugs. family history includes Arthritis in her  other and other; Gout in her father; Heart disease in her mother and other; Hypertension in her other; Stroke in her other. Allergies  Allergen Reactions  . Acetaminophen     rash  . Latex     rash  . Morphine     rash  . Penicillins     unknown   Current Outpatient Prescriptions on File Prior to Visit  Medication Sig Dispense Refill  . aspirin EC 81 MG tablet Take 1 tablet (81 mg total) by mouth daily. 90 tablet 11  . atorvastatin (LIPITOR) 10 MG tablet Take 1 tablet (10 mg total) by mouth daily. (Patient not taking: Reported on 07/26/2014) 90 tablet 3  . ibuprofen (ADVIL,MOTRIN) 600 MG tablet Take 1 tablet (600 mg total) by mouth every 6 (six) hours as needed. (Patient not taking: Reported on 10/03/2014) 30 tablet 0   No current facility-administered medications on file prior to visit.   Review of Systems Constitutional: Negative for increased diaphoresis, other activity, appetite or siginficant weight change other than noted HENT: Negative for worsening hearing loss, ear pain, facial swelling, mouth sores and neck stiffness.   Eyes: Negative for other worsening pain, redness or visual disturbance.  Respiratory: Negative for shortness of breath and wheezing  Cardiovascular: Negative for chest pain and palpitations.  Gastrointestinal: Negative for diarrhea, blood in stool, abdominal distention or other  pain Genitourinary: Negative for hematuria, flank pain or change in urine volume.  Musculoskeletal: Negative for myalgias or other joint complaints.  Skin: Negative for color change and wound or drainage.  Neurological: Negative for syncope and numbness. other than noted Hematological: Negative for adenopathy. or other swelling Psychiatric/Behavioral: Negative for hallucinations, SI, self-injury, decreased concentration or other worsening agitation.      Objective:   Physical Exam BP 122/76 mmHg  Pulse 84  Temp(Src) 97.8 F (36.6 C) (Oral)  Ht  (1.753 m)  Wt 181 lb (82.101  kg)  BMI 26.72 kg/m2  SpO2 98% VS noted,  Constitutional: Pt is oriented to person, place, and time. Appears well-developed and well-nourished, in no significant distress Head: Normocephalic and atraumatic.  Right Ear: External ear normal.  Left Ear: External ear normal.  Nose: Nose normal.  Mouth/Throat: Oropharynx is clear and moist.  Eyes: Conjunctivae and EOM are normal. Pupils are equal, round, and reactive to light.  Neck: Normal range of motion. Neck supple. No JVD present. No tracheal deviation present or significant neck LA or mass Cardiovascular: Normal rate, regular rhythm, normal heart sounds and intact distal pulses.   Pulmonary/Chest: Effort normal and breath sounds without rales or wheezing  Abdominal: Soft. Bowel sounds are normal. NT. No HSM  Musculoskeletal: Normal range of motion. Exhibits no edema.  Lymphadenopathy:  Has no cervical adenopathy.  Neurological: Pt is alert and oriented to person, place, and time. Pt has normal reflexes. No cranial nerve deficit. Motor grossly intact Skin: Skin is warm and dry. No rash noted.  Psychiatric:  Has normal mood and affect. Behavior is normal.     Assessment & Plan:

## 2014-10-03 NOTE — Patient Instructions (Signed)

## 2014-10-17 ENCOUNTER — Encounter (HOSPITAL_BASED_OUTPATIENT_CLINIC_OR_DEPARTMENT_OTHER): Payer: BLUE CROSS/BLUE SHIELD

## 2014-11-05 ENCOUNTER — Ambulatory Visit (INDEPENDENT_AMBULATORY_CARE_PROVIDER_SITE_OTHER): Payer: BLUE CROSS/BLUE SHIELD | Admitting: Internal Medicine

## 2014-11-05 ENCOUNTER — Encounter: Payer: Self-pay | Admitting: Internal Medicine

## 2014-11-05 VITALS — BP 118/70 | HR 68 | Ht 69.0 in | Wt 183.4 lb

## 2014-11-05 DIAGNOSIS — R0683 Snoring: Secondary | ICD-10-CM | POA: Diagnosis not present

## 2014-11-05 NOTE — Progress Notes (Signed)
   Subjective:    Patient ID: Deborah Pratt, female    DOB: [birth year], 59 y.o.   MRN: [id number]  HPI 39 yoF former smoker  Follows For: Referred by Dr. Jonny Ruiz for snoring. Pt states husband complains of snoring.   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 ENT-history of septoplasty made little improvement in her snoring. Denies active heart or lung disease. Remote history of pulmonary embolism over 30 years ago. Quit smoking 02/15/2014. Tennis player.  11/05/14- 55 yoF former smoker followed for snoring complicated by hx HBP, Allergic rhinitis,Hx PE NPSG 7.19/16- AHI 1.8/ hr, WNL, loud snoring, weight 178 lbs Follows For: pt states shes well, shes getting good sleep. here to review sleep study. We discussed primary snoring without obstructive sleep apnea. She declines flu vaccine  Review of Systems  Constitutional: Negative for fever and unexpected weight change.  HENT: Negative for congestion, dental problem, ear pain, nosebleeds, postnasal drip, rhinorrhea, sinus pressure and sneezing.   Eyes: Negative for redness and itching.  Respiratory: Negative for cough, chest tightness, shortness of breath and wheezing.   Cardiovascular: Positive for leg swelling. Negative for palpitations.  Gastrointestinal: Negative for nausea and vomiting.  Genitourinary: Negative for dysuria.  Musculoskeletal: Negative for joint swelling.  Skin: Negative for rash.  Neurological: Negative for headaches.  Hematological: Bruises/bleeds easily.   Objective:   Physical Exam OBJ- Physical Exam General- Alert, Oriented, Affect-appropriate, Distress- none acute Skin- rash-none, lesions- none, excoriation- none Lymphadenopathy- none Head- atraumatic            Eyes- Gross vision intact, PERRLA, conjunctivae and secretions clear            Ears- Hearing, canals-normal            Nose-  Clear, no-Septal dev, mucus, polyps, erosion, perforation             Throat- Mallampati III-IV , mucosa clear , drainage- none, tonsils- atrophic Neck- flexible , trachea midline, no stridor , thyroid nl, carotid no bruit Chest - symmetrical excursion , unlabored           Heart/CV- RRR , no murmur , no gallop  , no rub, nl s1 s2                           - JVD- none , edema- none, stasis changes- none, varices- none           Lung- clear to P&A, wheeze- none, cough- none , dullness-none, rub- none           Chest wall-  Abd-  Br/ Gen/ Rectal- Not done, not indicated Extrem- cyanosis- none, clubbing, none, atrophy- none, strength- nl Neuro- grossly intact to observation         Assessment & Plan:

## 2014-11-05 NOTE — Patient Instructions (Signed)
Snoring may be helped by:  OTC Breathe Right Nasal Strips    OTC  Mouth Piece- "boil and bite" types sold in drug stores for snoring  Sleep off flat of back  Try adding otc Flonase/ fluticasone nasal spray  1-2 puffs each nostril once daily at bedtime

## 2014-11-11 NOTE — Assessment & Plan Note (Signed)
She is snoring without obstructive sleep apnea. She feels she sleeps well. Plan-we reviewed simple and OTC measures for reducing snoring

## 2014-12-03 ENCOUNTER — Ambulatory Visit (INDEPENDENT_AMBULATORY_CARE_PROVIDER_SITE_OTHER): Payer: BLUE CROSS/BLUE SHIELD | Admitting: Emergency Medicine

## 2014-12-03 VITALS — BP 122/80 | HR 90 | Temp 98.1°F | Resp 16 | Ht 69.0 in | Wt 182.0 lb

## 2014-12-03 DIAGNOSIS — H5712 Ocular pain, left eye: Secondary | ICD-10-CM

## 2014-12-03 MED ORDER — POLYMYXIN B-TRIMETHOPRIM 10000-0.1 UNIT/ML-% OP SOLN: 2.0000 [drp] | OPHTHALMIC | Status: DC

## 2014-12-03 NOTE — Progress Notes (Signed)
Subjective:  Patient ID: Deborah Pratt, female    DOB: [birth year]  Age: 59 y.o. MRN: [id number]  CC: Belepharitis   HPI Deborah Pratt presents  with a four-day history of redness swelling and itching in her left eye. She has no history of injury. No photophobia. No blurred vision or difficulty patient. Foreign body sensation. She said she's used topical heat applications with no improvement. She has no drainage or redness of right itself. No antecedent illness. No sick contacts.  History Deborah Pratt has a past medical history of HYPERTENSION (11/08/2006); CHOLELITHIASIS (11/08/2006); Generalized headaches; UTI (lower urinary tract infection); Kidney stones; Allergy; ANXIETY (11/11/2006); Asthma; DEPRESSION (11/11/2006); Pulmonary embolism (HCC) (59 yo ); and Hyperlipidemia (10/03/2014).   She has past surgical history that includes Tubal ligation; Abdominal hysterectomy; Colonoscopy; and Nasal sinus surgery.   Her  family history includes Arthritis in her other and other; Gout in her father; Heart disease in her mother and other; Hypertension in her other; Stroke in her other.  She   reports that she quit smoking about 9 months ago. She has never used smokeless tobacco. She reports that she drinks alcohol. She reports that she does not use illicit drugs.  Outpatient Prescriptions Prior to Visit  Medication Sig Dispense Refill  . aspirin EC 81 MG tablet Take 1 tablet (81 mg total) by mouth daily. 90 tablet 11  . ibuprofen (ADVIL,MOTRIN) 600 MG tablet Take 1 tablet (600 mg total) by mouth every 6 (six) hours as needed. 30 tablet 0  . atorvastatin (LIPITOR) 10 MG tablet Take 1 tablet (10 mg total) by mouth daily. (Patient not taking: Reported on 07/26/2014) 90 tablet 3   No facility-administered medications prior to visit.    Social History   Social History  . Marital Status: Married    Spouse Name: Daron Offer  . Number of Children: 2  . Years of Education: 12    Occupational History  . Engineer, mining    Social History Main Topics  . Smoking status: Former Smoker    Quit date: 02/15/2014  . Smokeless tobacco: Never Used  . Alcohol Use: 0.0 oz/week    0 Standard drinks or equivalent per week  . Drug Use: No  . Sexual Activity: Not Asked   Other Topics Concern  . None   Social History Narrative   Lives with her husband and his son. Her adult sons live in [place].     Review of Systems  Constitutional: Negative for fever, chills and appetite change.  HENT: Negative for congestion, ear pain, postnasal drip, sinus pressure and sore throat.   Eyes: Positive for itching. Negative for photophobia, pain, discharge, redness and visual disturbance.  Respiratory: Negative for cough, shortness of breath and wheezing.   Cardiovascular: Negative for leg swelling.  Gastrointestinal: Negative for nausea, vomiting, abdominal pain, diarrhea, constipation and blood in stool.  Endocrine: Negative for polyuria.  Genitourinary: Negative for dysuria, urgency, frequency and flank pain.  Musculoskeletal: Negative for gait problem.  Skin: Negative for rash.  Neurological: Negative for weakness and headaches.  Psychiatric/Behavioral: Negative for confusion and decreased concentration. The patient is not nervous/anxious.     Objective:  BP 122/80 mmHg  Pulse 90  Temp(Src) 98.1 F (36.7 C) (Oral)  Resp 16  Ht  (1.753 m)  Wt 182 lb (82.555 kg)  BMI 26.86 kg/m2  SpO2 97%  Physical Exam  Constitutional: She is oriented to person, place, and time. She appears well-developed and well-nourished.  HENT:  Head:  Normocephalic and atraumatic.  Eyes: Conjunctivae are normal. Pupils are equal, round, and reactive to light. Lids are everted and swept, no foreign bodies found. Left eye exhibits no chemosis, no discharge, no exudate and no hordeolum. No foreign body present in the left eye.  Her left upper lid was rather swollen infected. There is no hordeolum  or chalazion and no cellulitis  Pulmonary/Chest: Effort normal.  Musculoskeletal: She exhibits no edema.  Neurological: She is alert and oriented to person, place, and time.  Skin: Skin is dry.  Psychiatric: She has a normal mood and affect. Her behavior is normal. Thought content normal.      Assessment & Plan:   Deborah Pratt was seen today for belepharitis.  Diagnoses and all orders for this visit:  Eye pain, left -     Ambulatory referral to Ophthalmology  Other orders -     trimethoprim-polymyxin b (POLYTRIM) ophthalmic solution; Place 2 drops into the left eye every 4 (four) hours.   I am having Deborah Pratt start on trimethoprim-polymyxin b. I am also having her maintain her ibuprofen, aspirin EC, and atorvastatin.  Meds ordered this encounter  Medications  . trimethoprim-polymyxin b (POLYTRIM) ophthalmic solution    Sig: Place 2 drops into the left eye every 4 (four) hours.    Dispense:  10 mL    Refill:  0     Appropriate red flag conditions were discussed with the patient as well as actions that should be taken.  Patient expressed his understanding.  Follow-up: Return if symptoms worsen or fail to improve.  Carmelina DaneAnderson, Jeffery S, MD

## 2014-12-03 NOTE — Patient Instructions (Signed)

## 2015-04-29 ENCOUNTER — Encounter (HOSPITAL_COMMUNITY): Payer: Self-pay | Admitting: Emergency Medicine

## 2015-04-29 ENCOUNTER — Emergency Department (HOSPITAL_COMMUNITY)
Admission: EM | Admit: 2015-04-29 | Discharge: 2015-04-29 | Disposition: A | Discharge status: Home / Self Care | Payer: BLUE CROSS/BLUE SHIELD | Attending: Physician Assistant | Admitting: Physician Assistant

## 2015-04-29 ENCOUNTER — Emergency Department (HOSPITAL_COMMUNITY): Payer: BLUE CROSS/BLUE SHIELD

## 2015-04-29 DIAGNOSIS — Z7982 Long term (current) use of aspirin: Secondary | ICD-10-CM | POA: Insufficient documentation

## 2015-04-29 DIAGNOSIS — Z87891 Personal history of nicotine dependence: Secondary | ICD-10-CM | POA: Diagnosis not present

## 2015-04-29 DIAGNOSIS — Y9289 Other specified places as the place of occurrence of the external cause: Secondary | ICD-10-CM | POA: Insufficient documentation

## 2015-04-29 DIAGNOSIS — Y9389 Activity, other specified: Secondary | ICD-10-CM | POA: Diagnosis not present

## 2015-04-29 DIAGNOSIS — Y998 Other external cause status: Secondary | ICD-10-CM | POA: Insufficient documentation

## 2015-04-29 DIAGNOSIS — S01511A Laceration without foreign body of lip, initial encounter: Secondary | ICD-10-CM | POA: Insufficient documentation

## 2015-04-29 DIAGNOSIS — Z87442 Personal history of urinary calculi: Secondary | ICD-10-CM | POA: Insufficient documentation

## 2015-04-29 DIAGNOSIS — Z91040 Latex allergy status: Secondary | ICD-10-CM | POA: Insufficient documentation

## 2015-04-29 DIAGNOSIS — Z79899 Other long term (current) drug therapy: Secondary | ICD-10-CM | POA: Insufficient documentation

## 2015-04-29 DIAGNOSIS — Z8659 Personal history of other mental and behavioral disorders: Secondary | ICD-10-CM | POA: Insufficient documentation

## 2015-04-29 DIAGNOSIS — IMO0002 Reserved for concepts with insufficient information to code with codable children: Secondary | ICD-10-CM

## 2015-04-29 DIAGNOSIS — W01198A Fall on same level from slipping, tripping and stumbling with subsequent striking against other object, initial encounter: Secondary | ICD-10-CM | POA: Insufficient documentation

## 2015-04-29 DIAGNOSIS — J45909 Unspecified asthma, uncomplicated: Secondary | ICD-10-CM | POA: Diagnosis not present

## 2015-04-29 DIAGNOSIS — I10 Essential (primary) hypertension: Secondary | ICD-10-CM | POA: Insufficient documentation

## 2015-04-29 DIAGNOSIS — Z8719 Personal history of other diseases of the digestive system: Secondary | ICD-10-CM | POA: Diagnosis not present

## 2015-04-29 DIAGNOSIS — Z8639 Personal history of other endocrine, nutritional and metabolic disease: Secondary | ICD-10-CM | POA: Insufficient documentation

## 2015-04-29 DIAGNOSIS — Z87440 Personal history of urinary (tract) infections: Secondary | ICD-10-CM | POA: Diagnosis not present

## 2015-04-29 DIAGNOSIS — Z86711 Personal history of pulmonary embolism: Secondary | ICD-10-CM | POA: Diagnosis not present

## 2015-04-29 DIAGNOSIS — S0993XA Unspecified injury of face, initial encounter: Secondary | ICD-10-CM | POA: Diagnosis present

## 2015-04-29 DIAGNOSIS — S0990XA Unspecified injury of head, initial encounter: Secondary | ICD-10-CM | POA: Insufficient documentation

## 2015-04-29 MED ORDER — OXYCODONE HCL 5 MG PO TABS
5.0000 mg | ORAL_TABLET | Freq: Once | ORAL | Status: AC
  Administered 2015-04-29: 5 mg via ORAL
  Filled 2015-04-29: qty 1

## 2015-04-29 MED ORDER — LIDOCAINE HCL (PF) 1 % IJ SOLN
30.0000 mL | Freq: Once | INTRAMUSCULAR | Status: DC
  Filled 2015-04-29: qty 30

## 2015-04-29 MED ORDER — LIDOCAINE-EPINEPHRINE (PF) 2 %-1:200000 IJ SOLN
10.0000 mL | Freq: Once | INTRAMUSCULAR | Status: AC
  Administered 2015-04-29: 20 mL

## 2015-04-29 MED ORDER — OXYCODONE HCL 5 MG PO TABS: 5.0000 mg | ORAL_TABLET | Freq: Four times a day (QID) | ORAL | Status: DC | PRN

## 2015-04-29 MED ORDER — TETANUS-DIPHTH-ACELL PERTUSSIS 5-2.5-18.5 LF-MCG/0.5 IM SUSP
0.5000 mL | Freq: Once | INTRAMUSCULAR | Status: DC
  Filled 2015-04-29: qty 0.5

## 2015-04-29 MED ORDER — LIDOCAINE-EPINEPHRINE (PF) 2 %-1:200000 IJ SOLN
INTRAMUSCULAR | Status: AC
  Administered 2015-04-29: 20 mL
  Filled 2015-04-29: qty 20

## 2015-04-29 NOTE — Discharge Instructions (Signed)
U had absorbable sutures placed place. If they do not dissolve within 5 days he may use a little bit of peroxide on a Q-tip to help with dissolving.  Please return with any signs of infection or other concerns.

## 2015-04-29 NOTE — ED Notes (Signed)
MD at bedside. 

## 2015-04-29 NOTE — ED Notes (Signed)
Patient transported to CT 

## 2015-04-29 NOTE — ED Notes (Addendum)
MD at bedside. 

## 2015-04-29 NOTE — ED Notes (Signed)
Pt states that she fell getting out of her truck this morning.  States that her face hit the concrete.  Laceration to lt upper lip.  States that her teeth feel secure.  Lt knee pain.

## 2015-04-29 NOTE — ED Provider Notes (Signed)
CSN: [id number]     Arrival date & time 04/29/15  4098 History   First MD Initiated Contact with Patient 04/29/15 0715     Chief Complaint  Patient presents with  . Fall  . Mouth Injury     (Consider location/radiation/quality/duration/timing/severity/associated sxs/prior Treatment) HPI   She is a 60 year old female who tried city bus presenting today after fall. Patient tried to get out of her car and slipped and fell and hit her head. Patient had a mechanical fall. Patient complaining of pain in her left maxilla and left lip.  Obvious laceration to the left lip on presentation.  Patient denies any loss of consciousness. Patient reports mild headache here.  Past Medical History  Diagnosis Date  . HYPERTENSION 11/08/2006    Qualifier: Diagnosis of  By: Maris Berger   . CHOLELITHIASIS 11/08/2006    Qualifier: Diagnosis of  By: Maris Berger   . Generalized headaches   . UTI (lower urinary tract infection)   . Kidney stones   . Allergy   . ANXIETY 11/11/2006    Qualifier: Diagnosis of  By: Jonny Ruiz MD, Len Blalock   . Asthma   . DEPRESSION 11/11/2006    Qualifier: Diagnosis of  By: Jonny Ruiz MD, Len Blalock   . Pulmonary embolism (HCC) 60 yo     on BCP  . Hyperlipidemia 10/03/2014   Past Surgical History  Procedure Laterality Date  . Tubal ligation    . Abdominal hysterectomy    . Colonoscopy    . Nasal sinus surgery     Family History  Problem Relation Age of Onset  . Heart disease Mother   . Gout Father   . Arthritis Other   . Heart disease Other   . Stroke Other   . Hypertension Other   . Arthritis Other    Social History  Substance Use Topics  . Smoking status: Former Smoker    Quit date: 02/15/2014  . Smokeless tobacco: Never Used  . Alcohol Use: 0.0 oz/week    0 Standard drinks or equivalent per week   OB History    No data available     Review of Systems  Constitutional: Negative for activity change and fatigue.  HENT: Negative for congestion.    Respiratory: Negative for cough and chest tightness.   Cardiovascular: Negative for chest pain.  Gastrointestinal: Negative for abdominal distention.  Genitourinary: Negative for dysuria.  Musculoskeletal: Negative for joint swelling.  Skin: Negative for rash.  Allergic/Immunologic: Negative for immunocompromised state.  Neurological: Positive for headaches. Negative for seizures, speech difficulty and weakness.  Psychiatric/Behavioral: Negative for behavioral problems and agitation.      Allergies  Other; Peanut-containing drug products; Shellfish allergy; Acetaminophen; Demerol; Fish oil; Latex; Morphine; Pork-derived products; and Tomato  Home Medications   Prior to Admission medications   Medication Sig Start Date End Date Taking? Authorizing Provider  aspirin EC 81 MG tablet Take 1 tablet (81 mg total) by mouth daily. 09/27/13   Corwin Levins, MD  atorvastatin (LIPITOR) 10 MG tablet Take 1 tablet (10 mg total) by mouth daily. Patient not taking: Reported on 07/26/2014 09/27/13   Corwin Levins, MD  ibuprofen (ADVIL,MOTRIN) 600 MG tablet Take 1 tablet (600 mg total) by mouth every 6 (six) hours as needed. 06/16/13   Roxy Horseman, PA-C  trimethoprim-polymyxin b (POLYTRIM) ophthalmic solution Place 2 drops into the left eye every 4 (four) hours. 12/03/14   Carmelina Dane, MD   BP 134/89 mmHg  Pulse 66  Temp(Src) 98.3 F (36.8 C) (Oral)  Resp 18  SpO2 97% Physical Exam  Constitutional: She is oriented to person, place, and time. She appears well-developed and well-nourished.  HENT:  Head: Normocephalic and atraumatic.  Patient is tenderness over the left maxilla. Patient has a laceration sustained to her left lip. Does not cross the vermilion border.  Patient has no looseness to her teeth. She feels her jaw is at baseline.  Eyes: Conjunctivae are normal. Right eye exhibits no discharge.  Neck: Neck supple.  Cardiovascular: Normal rate, regular rhythm and normal heart sounds.    No murmur heard. Pulmonary/Chest: Effort normal and breath sounds normal. She has no wheezes. She has no rales.  Abdominal: Soft. She exhibits no distension. There is no tenderness.  Musculoskeletal: Normal range of motion. She exhibits no edema.  Full range motion in bilateral upper and lower extremities  Neurological: She is oriented to person, place, and time. No cranial nerve deficit.  Skin: Skin is warm and dry. No rash noted. She is not diaphoretic.  Psychiatric: She has a normal mood and affect. Her behavior is normal.  Nursing note and vitals reviewed.   ED Course  Procedures (including critical care time) Labs Review Labs Reviewed - No data to display  Imaging Review Ct Head Wo Contrast  04/29/2015  CLINICAL DATA:  Facial trauma after falling getting out of her truck this morning. Laceration to the left upper lip. Swelling to the left cheek. EXAM: CT HEAD WITHOUT CONTRAST CT MAXILLOFACIAL WITHOUT CONTRAST TECHNIQUE: Multidetector CT imaging of the head and maxillofacial structures were performed using the standard protocol without intravenous contrast. Multiplanar CT image reconstructions of the maxillofacial structures were also generated. COMPARISON:  Maxillofacial CT scan dated 04/11/2014 FINDINGS: CT HEAD FINDINGS No mass lesion. No midline shift. No acute hemorrhage or hematoma. No extra-axial fluid collections. No evidence of acute infarction. Brain parenchyma is normal. Lungs are normal. CT MAXILLOFACIAL FINDINGS There is a soft tissue contusion of the left cheek. The bones of the face are normal. Paranasal sinuses are clear. Chronic degenerative changes of both mandibular condyles. IMPRESSION: 1. Normal CT scan of the head. 2. No acute abnormality of the maxillofacial bones. Left cheek contusion. Electronically Signed   By: Francene Boyers M.D.   On: 04/29/2015 08:31   Dg Knee Complete 4 Views Left  04/29/2015  CLINICAL DATA:  60 year old female fell from truck this morning.  Anterior left knee pain. Abrasions. Initial encounter. EXAM: LEFT KNEE - COMPLETE 4+ VIEW COMPARISON:  None. FINDINGS: Tricompartment degenerative changes. No fracture or dislocation. IMPRESSION: No fracture or dislocation. Tricompartment degenerative changes. Electronically Signed   By: Lacy Duverney M.D.   On: 04/29/2015 07:18   Ct Maxillofacial Wo Cm  04/29/2015  CLINICAL DATA:  Facial trauma after falling getting out of her truck this morning. Laceration to the left upper lip. Swelling to the left cheek. EXAM: CT HEAD WITHOUT CONTRAST CT MAXILLOFACIAL WITHOUT CONTRAST TECHNIQUE: Multidetector CT imaging of the head and maxillofacial structures were performed using the standard protocol without intravenous contrast. Multiplanar CT image reconstructions of the maxillofacial structures were also generated. COMPARISON:  Maxillofacial CT scan dated 04/11/2014 FINDINGS: CT HEAD FINDINGS No mass lesion. No midline shift. No acute hemorrhage or hematoma. No extra-axial fluid collections. No evidence of acute infarction. Brain parenchyma is normal. Lungs are normal. CT MAXILLOFACIAL FINDINGS There is a soft tissue contusion of the left cheek. The bones of the face are normal. Paranasal sinuses are clear. Chronic  degenerative changes of both mandibular condyles. IMPRESSION: 1. Normal CT scan of the head. 2. No acute abnormality of the maxillofacial bones. Left cheek contusion. Electronically Signed   By: Francene BoyersJames  Maxwell M.D.   On: 04/29/2015 08:31   I have personally reviewed and evaluated these images and lab results as part of my medical decision-making.   EKG Interpretation None      MDM   Final diagnoses:  None    Patient is a 60 year old female presenting after mechanical fall today. Patient has sustained a laceration to her left lip. Does not cross the vermilion border. Patient did not lose consciousness. Patient is significant tenderness to her left maxilla.   CT face and head negative.  Tdap  udated.  Laceration repaired.  Will send home with a work note and concussion precautions.  LACERATION REPAIR Performed by: Arlana Hoveourteney L Marrietta Thunder Authorized by: Arlana Hoveourteney L Keymiah Lyles Consent: Verbal consent obtained. Risks and benefits: risks, benefits and alternatives were discussed Consent given by: patient Patient identity confirmed: provided demographic data Prepped and Draped in normal sterile fashion Wound explored  Laceration Location: Left lip, upper lip left-hand side in the vermilion zone, does not cross vermilion border. Laceration extends into the wet portion of lip from dry lip. Essentially a through and through laceration of the lip.  Laceration Length: 5 cm  No Foreign Bodies seen or palpated  Anesthesia: local infiltration  Local anesthetic: lidocaine 2% w epinephrine  Anesthetic total: 4 ml  Irrigation method: syringe Amount of cleaning: standard  Skin closure:\ 5-0 chromic  Number of sutures: 4  Technique: interupted  Patient tolerance: Patient tolerated the procedure well with no immediate complications.    Aadarsh Cozort Randall AnLyn Dandrea Medders, MD 04/29/15 [phone]

## 2015-09-14 ENCOUNTER — Other Ambulatory Visit: Payer: Self-pay | Admitting: Radiology

## 2015-09-14 ENCOUNTER — Encounter: Payer: Self-pay | Admitting: Family Medicine

## 2015-09-14 ENCOUNTER — Ambulatory Visit (INDEPENDENT_AMBULATORY_CARE_PROVIDER_SITE_OTHER): Payer: BLUE CROSS/BLUE SHIELD | Admitting: Family Medicine

## 2015-09-14 VITALS — BP 140/86 | HR 70 | Temp 97.6°F

## 2015-09-14 DIAGNOSIS — R0789 Other chest pain: Secondary | ICD-10-CM

## 2015-09-14 DIAGNOSIS — R1084 Generalized abdominal pain: Secondary | ICD-10-CM | POA: Diagnosis not present

## 2015-09-14 LAB — POCT CBC
Granulocyte percent: 37.8 %G (ref 37–80)
HCT, POC: 34.7 % — AB (ref 37.7–47.9)
HEMOGLOBIN: 12 g/dL — AB (ref 12.2–16.2)
Lymph, poc: 2.1 (ref 0.6–3.4)
MCH: 30.8 pg (ref 27–31.2)
MCHC: 34.5 g/dL (ref 31.8–35.4)
MCV: 89.2 fL (ref 80–97)
MID (cbc): 0.4 (ref 0–0.9)
MPV: 8.4 fL (ref 0–99.8)
PLATELET COUNT, POC: 200 10*3/uL (ref 142–424)
POC Granulocyte: 1.5 — AB (ref 2–6.9)
POC LYMPH PERCENT: 53 %L — AB (ref 10–50)
POC MID %: 9.2 % (ref 0–12)
RBC: 3.89 M/uL — AB (ref 4.04–5.48)
RDW, POC: 13.4 %
WBC: 4 10*3/uL — AB (ref 4.6–10.2)

## 2015-09-14 LAB — COMPLETE METABOLIC PANEL WITH GFR
ALBUMIN: 3.9 g/dL (ref 3.6–5.1)
ALK PHOS: 143 U/L — AB (ref 33–130)
ALT: 18 U/L (ref 6–29)
AST: 16 U/L (ref 10–35)
BILIRUBIN TOTAL: 0.4 mg/dL (ref 0.2–1.2)
BUN: 11 mg/dL (ref 7–25)
CO2: 23 mmol/L (ref 20–31)
Calcium: 8.7 mg/dL (ref 8.6–10.4)
Chloride: 109 mmol/L (ref 98–110)
Creat: 0.9 mg/dL (ref 0.50–0.99)
GFR, EST NON AFRICAN AMERICAN: 70 mL/min (ref >=60)
GFR, Est African American: 80 mL/min (ref >=60)
GLUCOSE: 116 mg/dL — AB (ref 65–99)
Potassium: 3.9 mmol/L (ref 3.5–5.3)
SODIUM: 141 mmol/L (ref 135–146)
TOTAL PROTEIN: 6.6 g/dL (ref 6.1–8.1)

## 2015-09-14 LAB — POCT URINALYSIS DIP (MANUAL ENTRY)
BILIRUBIN UA: NEGATIVE
Glucose, UA: NEGATIVE
Ketones, POC UA: NEGATIVE
Leukocytes, UA: NEGATIVE
NITRITE UA: NEGATIVE
PH UA: 5.5
RBC UA: NEGATIVE
Spec Grav, UA: 1.02
UROBILINOGEN UA: 0.2

## 2015-09-14 LAB — POC MICROSCOPIC URINALYSIS (UMFC)

## 2015-09-14 MED ORDER — GI COCKTAIL ~~LOC~~: 30.0000 mL | Freq: Once | ORAL | Status: DC

## 2015-09-14 NOTE — Progress Notes (Signed)
Subjective:  By signing my name below, I, Deborah Pratt, attest that this documentation has been prepared under the direction and in the presence of Deborah Staggers, MD. Electronically Signed: Stann Pratt, Scribe. 09/14/2015 , 9:24 AM .  Patient was seen in Room 6 .   Patient ID: Deborah Pratt, female    DOB: 1955/05/05, 60 y.o.   MRN: [id number] No chief complaint on file.  HPI Deborah Pratt is a 60 y.o. female  Patient was brought back emergently complaining of lower chest pain and upper abdominal pain, primarily upper abdomen, with sweating that started an hour ago. She informs having some relief when she belches, but the gas fills right back up. She mentions having more pain when leaning forward. She hasn't attempted any treatments. She denies being on any chronic medication. She denies chronic medical issues. She denies nausea, vomiting, calf pain or swelling. She denies long distance travel via car ride or air travel. She denies urinary symptoms or diverticulitis. She had a past surgical history of tubal ligation.   Her mother had heart problems in her 9s with a heart murmur.  She had a PE in her 72s from birth control.   I was pulled acutely for patient care.   Patient Active Problem List   Diagnosis Date Noted   Hyperlipidemia 10/03/2014   Daytime somnolence 07/26/2014   Snoring 03/03/2012   Kidney stones    Allergic rhinitis, cause unspecified    Asthma    Pulmonary embolism (HCC)    Preventative health care 08/27/2011   HYPERSOMNIA 04/16/2008   ANXIETY 11/11/2006   Depression 11/11/2006   Essential hypertension 11/08/2006   CHOLELITHIASIS 11/08/2006   Headache(784.0) 11/08/2006   Past Medical History:  Diagnosis Date   Allergy    ANXIETY 11/11/2006   Qualifier: Diagnosis of  By: Jonny Ruiz MD, Len Blalock    Asthma    CHOLELITHIASIS 11/08/2006   Qualifier: Diagnosis of  By: Maris Berger    DEPRESSION 11/11/2006   Qualifier: Diagnosis of  By: Jonny Ruiz MD, Len Blalock    Generalized headaches    Hyperlipidemia 10/03/2014   HYPERTENSION 11/08/2006   Qualifier: Diagnosis of  By: Maris Berger    Kidney stones    Pulmonary embolism (HCC) 60 yo    on BCP   UTI (lower urinary tract infection)    Past Surgical History:  Procedure Laterality Date   ABDOMINAL HYSTERECTOMY     COLONOSCOPY     NASAL SINUS SURGERY     TUBAL LIGATION     Allergies  Allergen Reactions   Other Anaphylaxis    *Potatoes*    Peanut-Containing Drug Products Hives   Shellfish Allergy Hives   Acetaminophen Rash   Demerol [Meperidine] Hives   Fish Oil Hives   Latex Rash   Morphine Rash   Pork-Derived Products Hives   Tomato Hives   Prior to Admission medications   Medication Sig Start Date End Date Taking? Authorizing Provider  aspirin EC 81 MG tablet Take 1 tablet (81 mg total) by mouth daily. Patient not taking: Reported on 04/29/2015 09/27/13   Corwin Levins, MD  atorvastatin (LIPITOR) 10 MG tablet Take 1 tablet (10 mg total) by mouth daily. Patient not taking: Reported on 07/26/2014 09/27/13   Corwin Levins, MD  Multiple Vitamin (MULTIVITAMIN WITH MINERALS) TABS tablet Take 1 tablet by mouth daily.    Historical Provider, MD  oxyCODONE (ROXICODONE) 5 MG immediate release tablet Take 1 tablet (5 mg total)  by mouth every 6 (six) hours as needed for severe pain. 04/29/15   Courteney Lyn Mackuen, MD  trimethoprim-polymyxin b (POLYTRIM) ophthalmic solution Place 2 drops into the left eye every 4 (four) hours. Patient not taking: Reported on 04/29/2015 12/03/14   Carmelina Dane, MD   Social History   Social History   Marital status: Married    Spouse name: Daron Offer   Number of children: 2   Years of education: 12   Occupational History   Engineer, mining    Social History Main Topics   Smoking status: Former Smoker    Quit date: 02/15/2014   Smokeless tobacco: Never Used   Alcohol use 0.0  oz/week   Drug use: No   Sexual activity: Not on file   Other Topics Concern   Not on file   Social History Narrative   Lives with her husband and his son. Her adult sons live in [place].   Review of Systems  Constitutional: Positive for diaphoresis. Negative for chills, fatigue and fever.  Respiratory: Negative for cough, shortness of breath and wheezing.   Cardiovascular: Positive for chest pain. Negative for leg swelling.  Gastrointestinal: Positive for abdominal pain. Negative for nausea and vomiting.  Genitourinary: Negative for dysuria and frequency.       Objective:   Physical Exam  Constitutional: She is oriented to person, place, and time. She appears well-developed and well-nourished. No distress.  HENT:  Head: Normocephalic and atraumatic.  Eyes: EOM are normal. Pupils are equal, round, and reactive to light.  Neck: Neck supple.  Cardiovascular: Normal rate, regular rhythm and normal heart sounds.   No murmur heard. Pulmonary/Chest: Effort normal and breath sounds normal. No respiratory distress.  Abdominal: There is tenderness. There is rebound. There is no CVA tenderness.  Very tender upper abdomen with some rebound  Musculoskeletal: Normal range of motion.  Neurological: She is alert and oriented to person, place, and time.  Skin: Skin is warm and dry.  Psychiatric: She has a normal mood and affect. Her behavior is normal.  Nursing note and vitals reviewed.  EKG: Sinus bradycardia rate 58, no acute findings  Vitals:   09/14/15 0925  BP: 140/86  Pulse: 70  Temp: 97.6 F (36.4 C)   Results for orders placed or performed in visit on 09/14/15  POCT CBC  Result Value Ref Range   WBC 4.0 (A) 4.6 - 10.2 K/uL   Lymph, poc 2.1 0.6 - 3.4   POC LYMPH PERCENT 53.0 (A) 10 - 50 %L   MID (cbc) 0.4 0 - 0.9   POC MID % 9.2 0 - 12 %M   POC Granulocyte 1.5 (A) 2 - 6.9   Granulocyte percent 37.8 37 - 80 %G   RBC 3.89 (A) 4.04 - 5.48 M/uL   Hemoglobin 12.0 (A)  12.2 - 16.2 g/dL   HCT, POC 16.1 (A) 09.6 - 47.9 %   MCV 89.2 80 - 97 fL   MCH, POC 30.8 27 - 31.2 pg   MCHC 34.5 31.8 - 35.4 g/dL   RDW, POC 04.5 %   Platelet Count, POC 200 142 - 424 K/uL   MPV 8.4 0 - 99.8 fL  POCT urinalysis dipstick  Result Value Ref Range   Color, UA yellow yellow   Clarity, UA clear clear   Glucose, UA negative negative   Bilirubin, UA negative negative   Ketones, POC UA negative negative   Spec Grav, UA 1.020    Blood, UA negative negative  pH, UA 5.5    Protein Ur, POC trace (A) negative   Urobilinogen, UA 0.2    Nitrite, UA Negative Negative   Leukocytes, UA Negative Negative  POCT Microscopic Urinalysis ([facility])  Result Value Ref Range   WBC,UR,HPF,POC None None WBC/hpf   RBC,UR,HPF,POC None None RBC/hpf   Bacteria Moderate (A) None, Too numerous to count   Mucus Present (A) Absent   Epithelial Cells, UR Per Microscopy Moderate (A) None, Too numerous to count cells/hpf   [10:36AM] she's feeling much improved; belched multiple times, abdominal pain resolved; on exam, abdominal soft, non tender, non distended and appears comfortable; discussed abdominal imaging and GI cocktail, but declined both at this time    Assessment & Plan:    IVON ROEDEL is a 60 y.o. female Other chest pain - Plan: EKG 12-Lead  Abdominal pain, generalized - Plan: POCT CBC, POCT urinalysis dipstick, POCT Microscopic Urinalysis ([facility]), COMPLETE METABOLIC PANEL WITH GFR, DISCONTINUED: gi cocktail (Maalox,Lidocaine,Donnatal)  Brought back emergently due to chest and upper abdominal pain, diaphoresis. EKG reassuring, CBC and urinalysis overall reassuring. Symptoms improved with belching while in office. Repeat exam without any concerning findings, and nontender abdomen. No nausea/vomiting, doubt small bowel obstruction, but discussed imaging and possible GI cocktail. These were declined at this time.  - Symptomatic care discussed with bland diet and avoidance of foods  associated with GERD. If abdominal pain/chest pain returns, advised to return here or emergency room right away for further evaluation. CMP pending. .  Borderline hemoglobin, recommend a recheck is in the next week or 2 and possible iron tests and at that time. RTC precautions if symptomatic.  Meds ordered this encounter  Medications   DISCONTD: gi cocktail (Maalox,Lidocaine,Donnatal)   Patient Instructions    As your pain has improved with belching, as well as other reassuring tests here in the office, okay to hold off on further testing at this time. Make sure you are drinking plenty of fluids, bland diet today, then if pain does not improve in the next 24-48 hours, recommend you recheck here or the emergency room if any worsening of your symptoms.  If any return of chest pain, be seen immediately in the ER.   I also included some foods below that are known to be associated with heartburn. You may want to avoid these temporarily.  Your blood count was borderline low, so recommended you have this rechecked in the next few weeks. Sooner if any dark or tarry stools, lightheadedness, dizziness, or worsening symptoms.  Return to the clinic or go to the nearest emergency room if any of your symptoms worsen or new symptoms occur.    Abdominal Pain, Adult Many things can cause abdominal pain. Usually, abdominal pain is not caused by a disease and will improve without treatment. It can often be observed and treated at home. Your health care provider will do a physical exam and possibly order blood tests and X-rays to help determine the seriousness of your pain. However, in many cases, more time must pass before a clear cause of the pain can be found. Before that point, your health care provider may not know if you need more testing or further treatment. HOME CARE INSTRUCTIONS Monitor your abdominal pain for any changes. The following actions may help to alleviate any discomfort you are  experiencing:  Only take over-the-counter or prescription medicines as directed by your health care provider.  Do not take laxatives unless directed to do so by your health care provider.  Try a clear liquid diet (broth, tea, or water) as directed by your health care provider. Slowly move to a bland diet as tolerated. SEEK MEDICAL CARE IF:  You have unexplained abdominal pain.  You have abdominal pain associated with nausea or diarrhea.  You have pain when you urinate or have a bowel movement.  You experience abdominal pain that wakes you in the night.  You have abdominal pain that is worsened or improved by eating food.  You have abdominal pain that is worsened with eating fatty foods.  You have a fever. SEEK IMMEDIATE MEDICAL CARE IF:  Your pain does not go away within 2 hours.  You keep throwing up (vomiting).  Your pain is felt only in portions of the abdomen, such as the right side or the left lower portion of the abdomen.  You pass bloody or black tarry stools. MAKE SURE YOU:  Understand these instructions.  Will watch your condition.  Will get help right away if you are not doing well or get worse.   This information is not intended to replace advice given to you by your health care provider. Make sure you discuss any questions you have with your health care provider.   Document Released: 11/12/2004 Document Revised: 10/24/2014 Document Reviewed: 10/12/2012 Elsevier Interactive Patient Education 2016 [employer] Inc[employer]   Nonspecific Chest Pain  Chest pain can be caused by many different conditions. There is always a chance that your pain could be related to something serious, such as a heart attack or a blood clot in your lungs. Chest pain can also be caused by conditions that are not life-threatening. If you have chest pain, it is very important to follow up with your health care provider. CAUSES  Chest pain can be caused by:  Heartburn.  Pneumonia or  bronchitis.  Anxiety or stress.  Inflammation around your heart (pericarditis) or lung (pleuritis or pleurisy).  A blood clot in your lung.  A collapsed lung (pneumothorax). It can develop suddenly on its own (spontaneous pneumothorax) or from trauma to the chest.  Shingles infection (varicella-zoster virus).  Heart attack.  Damage to the bones, muscles, and cartilage that make up your chest wall. This can include:  Bruised bones due to injury.  Strained muscles or cartilage due to frequent or repeated coughing or overwork.  Fracture to one or more ribs.  Sore cartilage due to inflammation (costochondritis). RISK FACTORS  Risk factors for chest pain may include:  Activities that increase your risk for trauma or injury to your chest.  Respiratory infections or conditions that cause frequent coughing.  Medical conditions or overeating that can cause heartburn.  Heart disease or family history of heart disease.  Conditions or health behaviors that increase your risk of developing a blood clot.  Having had chicken pox (varicella zoster). SIGNS AND SYMPTOMS Chest pain can feel like:  Burning or tingling on the surface of your chest or deep in your chest.  Crushing, pressure, aching, or squeezing pain.  Dull or sharp pain that is worse when you move, cough, or take a deep breath.  Pain that is also felt in your back, neck, shoulder, or arm, or pain that spreads to any of these areas. Your chest pain may come and go, or it may stay constant. DIAGNOSIS Lab tests or other studies may be needed to find the cause of your pain. Your health care provider may have you take a test called an ambulatory ECG (electrocardiogram). An ECG records your heartbeat patterns  at the time the test is performed. You may also have other tests, such as:  Transthoracic echocardiogram (TTE). During echocardiography, sound waves are used to create a picture of all of the heart structures and to look  at how blood flows through your heart.  Transesophageal echocardiogram (TEE).This is a more advanced imaging test that obtains images from inside your body. It allows your health care provider to see your heart in finer detail.  Cardiac monitoring. This allows your health care provider to monitor your heart rate and rhythm in real time.  Holter monitor. This is a portable device that records your heartbeat and can help to diagnose abnormal heartbeats. It allows your health care provider to track your heart activity for several days, if needed.  Stress tests. These can be done through exercise or by taking medicine that makes your heart beat more quickly.  Blood tests.  Imaging tests. TREATMENT  Your treatment depends on what is causing your chest pain. Treatment may include:  Medicines. These may include:  Acid blockers for heartburn.  Anti-inflammatory medicine.  Pain medicine for inflammatory conditions.  Antibiotic medicine, if an infection is present.  Medicines to dissolve blood clots.  Medicines to treat coronary artery disease.  Supportive care for conditions that do not require medicines. This may include:  Resting.  Applying heat or cold packs to injured areas.  Limiting activities until pain decreases. HOME CARE INSTRUCTIONS  If you were prescribed an antibiotic medicine, finish it all even if you start to feel better.  Avoid any activities that bring on chest pain.  Do not use any tobacco products, including cigarettes, chewing tobacco, or electronic cigarettes. If you need help quitting, ask your health care provider.  Do not drink alcohol.  Take medicines only as directed by your health care provider.  Keep all follow-up visits as directed by your health care provider. This is important. This includes any further testing if your chest pain does not go away.  If heartburn is the cause for your chest pain, you may be told to keep your head raised  (elevated) while sleeping. This reduces the chance that acid will go from your stomach into your esophagus.  Make lifestyle changes as directed by your health care provider. These may include:  Getting regular exercise. Ask your health care provider to suggest some activities that are safe for you.  Eating a heart-healthy diet. A registered dietitian can help you to learn healthy eating options.  Maintaining a healthy weight.  Managing diabetes, if necessary.  Reducing stress. SEEK MEDICAL CARE IF:  Your chest pain does not go away after treatment.  You have a rash with blisters on your chest.  You have a fever. SEEK IMMEDIATE MEDICAL CARE IF:   Your chest pain is worse.  You have an increasing cough, or you cough up blood.  You have severe abdominal pain.  You have severe weakness.  You faint.  You have chills.  You have sudden, unexplained chest discomfort.  You have sudden, unexplained discomfort in your arms, back, neck, or jaw.  You have shortness of breath at any time.  You suddenly start to sweat, or your skin gets clammy.  You feel nauseous or you vomit.  You suddenly feel light-headed or dizzy.  Your heart begins to beat quickly, or it feels like it is skipping beats. These symptoms may represent a serious problem that is an emergency. Do not wait to see if the symptoms will go away. Get medical  help right away. Call your local emergency services (911 in the [place] Do not drive yourself to the hospital.   This information is not intended to replace advice given to you by your health care provider. Make sure you discuss any questions you have with your health care provider.   Document Released: 11/12/2004 Document Revised: 02/23/2014 Document Reviewed: 09/08/2013 Elsevier Interactive Patient Education 2016 [employer].  Food Choices for Gastroesophageal Reflux Disease, Adult When you have gastroesophageal reflux disease (GERD), the foods you eat and  your eating habits are very important. Choosing the right foods can help ease the discomfort of GERD. WHAT GENERAL GUIDELINES DO I NEED TO FOLLOW?  Choose fruits, vegetables, whole grains, low-fat dairy products, and low-fat meat, fish, and poultry.  Limit fats such as oils, salad dressings, butter, nuts, and avocado.  Keep a food diary to identify foods that cause symptoms.  Avoid foods that cause reflux. These may be different for different people.  Eat frequent small meals instead of three large meals each day.  Eat your meals slowly, in a relaxed setting.  Limit fried foods.  Cook foods using methods other than frying.  Avoid drinking alcohol.  Avoid drinking large amounts of liquids with your meals.  Avoid bending over or lying down until 2-3 hours after eating. WHAT FOODS ARE NOT RECOMMENDED? The following are some foods and drinks that may worsen your symptoms: Vegetables Tomatoes. Tomato juice. Tomato and spaghetti sauce. Chili peppers. Onion and garlic. Horseradish. Fruits Oranges, grapefruit, and lemon (fruit and juice). Meats High-fat meats, fish, and poultry. This includes hot dogs, ribs, ham, sausage, salami, and bacon. Dairy Whole milk and chocolate milk. Sour cream. Cream. Butter. Ice cream. Cream cheese.  Beverages Coffee and tea, with or without caffeine. Carbonated beverages or energy drinks. Condiments Hot sauce. Barbecue sauce.  Sweets/Desserts Chocolate and cocoa. Donuts. Peppermint and spearmint. Fats and Oils High-fat foods, including [place] fries and potato chips. Other Vinegar. Strong spices, such as black pepper, white pepper, red pepper, cayenne, curry powder, cloves, ginger, and chili powder. The items listed above may not be a complete list of foods and beverages to avoid. Contact your dietitian for more information.   This information is not intended to replace advice given to you by your health care provider. Make sure you discuss any  questions you have with your health care provider.   Document Released: 02/02/2005 Document Revised: 02/23/2014 Document Reviewed: 12/07/2012 Elsevier Interactive Patient Education 2016 [employer].   IF you received an x-ray today, you will receive an invoice from [facility]. Please contact [facility] at [phone] with questions or concerns regarding your invoice.   IF you received labwork today, you will receive an invoice from [employer]. Please contact [facility] at [phone] with questions or concerns regarding your invoice.   Our billing staff will not be able to assist you with questions regarding bills from these companies.  You will be contacted with the lab results as soon as they are available. The fastest way to get your results is to activate your My Chart account. Instructions are located on the last page of this paperwork. If you have not heard from [name] regarding the results in 2 weeks, please contact this office.        I personally performed the services described in this documentation, which was scribed in my presence. The recorded information has been reviewed and considered, and addended by me as needed.   Signed,   Deborah Staggers,  MD [facility].  09/14/15 10:48 AM

## 2015-09-14 NOTE — Patient Instructions (Addendum)
As your pain has improved with belching, as well as other reassuring tests here in the office, okay to hold off on further testing at this time. Make sure you are drinking plenty of fluids, bland diet today, then if pain does not improve in the next 24-48 hours, recommend you recheck here or the emergency room if any worsening of your symptoms.  If any return of chest pain, be seen immediately in the ER.   I also included some foods below that are known to be associated with heartburn. You may want to avoid these temporarily.  Your blood count was borderline low, so recommended you have this rechecked in the next few weeks. Sooner if any dark or tarry stools, lightheadedness, dizziness, or worsening symptoms.  Return to the clinic or go to the nearest emergency room if any of your symptoms worsen or new symptoms occur.    Abdominal Pain, Adult Many things can cause abdominal pain. Usually, abdominal pain is not caused by a disease and will improve without treatment. It can often be observed and treated at home. Your health care provider will do a physical exam and possibly order blood tests and X-rays to help determine the seriousness of your pain. However, in many cases, more time must pass before a clear cause of the pain can be found. Before that point, your health care provider may not know if you need more testing or further treatment. HOME CARE INSTRUCTIONS Monitor your abdominal pain for any changes. The following actions may help to alleviate any discomfort you are experiencing:  Only take over-the-counter or prescription medicines as directed by your health care provider.  Do not take laxatives unless directed to do so by your health care provider.  Try a clear liquid diet (broth, tea, or water) as directed by your health care provider. Slowly move to a bland diet as tolerated. SEEK MEDICAL CARE IF:  You have unexplained abdominal pain.  You have abdominal pain associated with  nausea or diarrhea.  You have pain when you urinate or have a bowel movement.  You experience abdominal pain that wakes you in the night.  You have abdominal pain that is worsened or improved by eating food.  You have abdominal pain that is worsened with eating fatty foods.  You have a fever. SEEK IMMEDIATE MEDICAL CARE IF:  Your pain does not go away within 2 hours.  You keep throwing up (vomiting).  Your pain is felt only in portions of the abdomen, such as the right side or the left lower portion of the abdomen.  You pass bloody or black tarry stools. MAKE SURE YOU:  Understand these instructions.  Will watch your condition.  Will get help right away if you are not doing well or get worse.   This information is not intended to replace advice given to you by your health care provider. Make sure you discuss any questions you have with your health care provider.   Document Released: 11/12/2004 Document Revised: 10/24/2014 Document Reviewed: 10/12/2012 Elsevier Interactive Patient Education 2016 [employer]   Nonspecific Chest Pain  Chest pain can be caused by many different conditions. There is always a chance that your pain could be related to something serious, such as a heart attack or a blood clot in your lungs. Chest pain can also be caused by conditions that are not life-threatening. If you have chest pain, it is very important to follow up with your health care provider. CAUSES  Chest pain can  be caused by:  Heartburn.  Pneumonia or bronchitis.  Anxiety or stress.  Inflammation around your heart (pericarditis) or lung (pleuritis or pleurisy).  A blood clot in your lung.  A collapsed lung (pneumothorax). It can develop suddenly on its own (spontaneous pneumothorax) or from trauma to the chest.  Shingles infection (varicella-zoster virus).  Heart attack.  Damage to the bones, muscles, and cartilage that make up your chest wall. This can  include:  Bruised bones due to injury.  Strained muscles or cartilage due to frequent or repeated coughing or overwork.  Fracture to one or more ribs.  Sore cartilage due to inflammation (costochondritis). RISK FACTORS  Risk factors for chest pain may include:  Activities that increase your risk for trauma or injury to your chest.  Respiratory infections or conditions that cause frequent coughing.  Medical conditions or overeating that can cause heartburn.  Heart disease or family history of heart disease.  Conditions or health behaviors that increase your risk of developing a blood clot.  Having had chicken pox (varicella zoster). SIGNS AND SYMPTOMS Chest pain can feel like:  Burning or tingling on the surface of your chest or deep in your chest.  Crushing, pressure, aching, or squeezing pain.  Dull or sharp pain that is worse when you move, cough, or take a deep breath.  Pain that is also felt in your back, neck, shoulder, or arm, or pain that spreads to any of these areas. Your chest pain may come and go, or it may stay constant. DIAGNOSIS Lab tests or other studies may be needed to find the cause of your pain. Your health care provider may have you take a test called an ambulatory ECG (electrocardiogram). An ECG records your heartbeat patterns at the time the test is performed. You may also have other tests, such as:  Transthoracic echocardiogram (TTE). During echocardiography, sound waves are used to create a picture of all of the heart structures and to look at how blood flows through your heart.  Transesophageal echocardiogram (TEE).This is a more advanced imaging test that obtains images from inside your body. It allows your health care provider to see your heart in finer detail.  Cardiac monitoring. This allows your health care provider to monitor your heart rate and rhythm in real time.  Holter monitor. This is a portable device that records your heartbeat and  can help to diagnose abnormal heartbeats. It allows your health care provider to track your heart activity for several days, if needed.  Stress tests. These can be done through exercise or by taking medicine that makes your heart beat more quickly.  Blood tests.  Imaging tests. TREATMENT  Your treatment depends on what is causing your chest pain. Treatment may include:  Medicines. These may include:  Acid blockers for heartburn.  Anti-inflammatory medicine.  Pain medicine for inflammatory conditions.  Antibiotic medicine, if an infection is present.  Medicines to dissolve blood clots.  Medicines to treat coronary artery disease.  Supportive care for conditions that do not require medicines. This may include:  Resting.  Applying heat or cold packs to injured areas.  Limiting activities until pain decreases. HOME CARE INSTRUCTIONS  If you were prescribed an antibiotic medicine, finish it all even if you start to feel better.  Avoid any activities that bring on chest pain.  Do not use any tobacco products, including cigarettes, chewing tobacco, or electronic cigarettes. If you need help quitting, ask your health care provider.  Do not drink alcohol.  Take medicines only as directed by your health care provider.  Keep all follow-up visits as directed by your health care provider. This is important. This includes any further testing if your chest pain does not go away.  If heartburn is the cause for your chest pain, you may be told to keep your head raised (elevated) while sleeping. This reduces the chance that acid will go from your stomach into your esophagus.  Make lifestyle changes as directed by your health care provider. These may include:  Getting regular exercise. Ask your health care provider to suggest some activities that are safe for you.  Eating a heart-healthy diet. A registered dietitian can help you to learn healthy eating options.  Maintaining a  healthy weight.  Managing diabetes, if necessary.  Reducing stress. SEEK MEDICAL CARE IF:  Your chest pain does not go away after treatment.  You have a rash with blisters on your chest.  You have a fever. SEEK IMMEDIATE MEDICAL CARE IF:   Your chest pain is worse.  You have an increasing cough, or you cough up blood.  You have severe abdominal pain.  You have severe weakness.  You faint.  You have chills.  You have sudden, unexplained chest discomfort.  You have sudden, unexplained discomfort in your arms, back, neck, or jaw.  You have shortness of breath at any time.  You suddenly start to sweat, or your skin gets clammy.  You feel nauseous or you vomit.  You suddenly feel light-headed or dizzy.  Your heart begins to beat quickly, or it feels like it is skipping beats. These symptoms may represent a serious problem that is an emergency. Do not wait to see if the symptoms will go away. Get medical help right away. Call your local emergency services (911 in the [place] Do not drive yourself to the hospital.   This information is not intended to replace advice given to you by your health care provider. Make sure you discuss any questions you have with your health care provider.   Document Released: 11/12/2004 Document Revised: 02/23/2014 Document Reviewed: 09/08/2013 Elsevier Interactive Patient Education 2016 [employer].  Food Choices for Gastroesophageal Reflux Disease, Adult When you have gastroesophageal reflux disease (GERD), the foods you eat and your eating habits are very important. Choosing the right foods can help ease the discomfort of GERD. WHAT GENERAL GUIDELINES DO I NEED TO FOLLOW?  Choose fruits, vegetables, whole grains, low-fat dairy products, and low-fat meat, fish, and poultry.  Limit fats such as oils, salad dressings, butter, nuts, and avocado.  Keep a food diary to identify foods that cause symptoms.  Avoid foods that cause reflux. These  may be different for different people.  Eat frequent small meals instead of three large meals each day.  Eat your meals slowly, in a relaxed setting.  Limit fried foods.  Cook foods using methods other than frying.  Avoid drinking alcohol.  Avoid drinking large amounts of liquids with your meals.  Avoid bending over or lying down until 2-3 hours after eating. WHAT FOODS ARE NOT RECOMMENDED? The following are some foods and drinks that may worsen your symptoms: Vegetables Tomatoes. Tomato juice. Tomato and spaghetti sauce. Chili peppers. Onion and garlic. Horseradish. Fruits Oranges, grapefruit, and lemon (fruit and juice). Meats High-fat meats, fish, and poultry. This includes hot dogs, ribs, ham, sausage, salami, and bacon. Dairy Whole milk and chocolate milk. Sour cream. Cream. Butter. Ice cream. Cream cheese.  Beverages Coffee and tea, with or without  caffeine. Carbonated beverages or energy drinks. Condiments Hot sauce. Barbecue sauce.  Sweets/Desserts Chocolate and cocoa. Donuts. Peppermint and spearmint. Fats and Oils High-fat foods, including [place] fries and potato chips. Other Vinegar. Strong spices, such as black pepper, white pepper, red pepper, cayenne, curry powder, cloves, ginger, and chili powder. The items listed above may not be a complete list of foods and beverages to avoid. Contact your dietitian for more information.   This information is not intended to replace advice given to you by your health care provider. Make sure you discuss any questions you have with your health care provider.   Document Released: 02/02/2005 Document Revised: 02/23/2014 Document Reviewed: 12/07/2012 Elsevier Interactive Patient Education 2016 [employer].   IF you received an x-ray today, you will receive an invoice from [facility]. Please contact [facility] at [phone] with questions or concerns regarding your invoice.   IF you received labwork  today, you will receive an invoice from [employer]. Please contact [facility] at [phone] with questions or concerns regarding your invoice.   Our billing staff will not be able to assist you with questions regarding bills from these companies.  You will be contacted with the lab results as soon as they are available. The fastest way to get your results is to activate your My Chart account. Instructions are located on the last page of this paperwork. If you have not heard from [name] regarding the results in 2 weeks, please contact this office.

## 2016-05-14 ENCOUNTER — Encounter: Payer: Self-pay | Admitting: Gastroenterology

## 2016-06-06 ENCOUNTER — Encounter (HOSPITAL_COMMUNITY): Payer: Self-pay | Admitting: Emergency Medicine

## 2016-06-06 ENCOUNTER — Ambulatory Visit (HOSPITAL_COMMUNITY)
Admission: EM | Admit: 2016-06-06 | Discharge: 2016-06-06 | Disposition: A | Discharge status: Home / Self Care | Payer: BLUE CROSS/BLUE SHIELD | Attending: Family Medicine | Admitting: Family Medicine

## 2016-06-06 DIAGNOSIS — H9201 Otalgia, right ear: Secondary | ICD-10-CM | POA: Diagnosis not present

## 2016-06-06 DIAGNOSIS — H6121 Impacted cerumen, right ear: Secondary | ICD-10-CM | POA: Diagnosis not present

## 2016-06-06 DIAGNOSIS — R42 Dizziness and giddiness: Secondary | ICD-10-CM | POA: Diagnosis not present

## 2016-06-06 NOTE — ED Triage Notes (Signed)
Pt c/o feeling dizzy onset 1200 associated w/left ear pain, nauseas  Denies fevers, vomiting, syncope episodes  A&O x4... NAD

## 2016-06-06 NOTE — ED Provider Notes (Signed)
CSN: [id number]     Arrival date & time 06/06/16  1700 History   First MD Initiated Contact with Patient 06/06/16 1814     Chief Complaint  Patient presents with  . Dizziness   (Consider location/radiation/quality/duration/timing/severity/associated sxs/prior Treatment) Patient is 61 y.o. Female, appears well, here for intermittent dizziness with room spinning sensation onset today at noon accompany by left earache, tinnitus, decreased in hearing and feeling a bit nauseous. She denies fever. She denies history of vertigo. She denies headache. Husband denies changes in patient's mental status or her speech.         Past Medical History:  Diagnosis Date  . Allergy   . ANXIETY 11/11/2006   Qualifier: Diagnosis of  By: Jonny Ruiz MD, Len Blalock   . Asthma   . CHOLELITHIASIS 11/08/2006   Qualifier: Diagnosis of  By: Maris Berger   . DEPRESSION 11/11/2006   Qualifier: Diagnosis of  By: Jonny Ruiz MD, Len Blalock   . Generalized headaches   . Hyperlipidemia 10/03/2014  . HYPERTENSION 11/08/2006   Qualifier: Diagnosis of  By: Maris Berger   . Kidney stones   . Pulmonary embolism (HCC) 61 yo    on BCP  . UTI (lower urinary tract infection)    Past Surgical History:  Procedure Laterality Date  . ABDOMINAL HYSTERECTOMY    . COLONOSCOPY    . NASAL SINUS SURGERY    . TUBAL LIGATION     Family History  Problem Relation Age of Onset  . Heart disease Mother   . Gout Father   . Arthritis Other   . Arthritis Other   . Heart disease Other   . Stroke Other   . Hypertension Other    Social History  Substance Use Topics  . Smoking status: Former Smoker    Quit date: 02/15/2014  . Smokeless tobacco: Never Used  . Alcohol use 0.0 oz/week   OB History    No data available     Review of Systems  Constitutional: Negative for chills, fatigue and fever.  HENT: Positive for ear pain and tinnitus. Negative for ear discharge.   Respiratory: Negative for shortness of breath and  wheezing.   Cardiovascular: Negative for chest pain and palpitations.  Gastrointestinal: Negative for abdominal pain and nausea.  Neurological: Positive for dizziness. Negative for syncope, weakness and headaches.    Allergies  Other; Peanut-containing drug products; Shellfish allergy; Acetaminophen; Demerol [meperidine]; Fish oil; Latex; Morphine; Pork-derived products; and Tomato  Home Medications   Prior to Admission medications   Medication Sig Start Date End Date Taking? Authorizing Provider  Multiple Vitamin (MULTIVITAMIN WITH MINERALS) TABS tablet Take 1 tablet by mouth daily.    Historical Provider, MD  oxyCODONE (ROXICODONE) 5 MG immediate release tablet Take 1 tablet (5 mg total) by mouth every 6 (six) hours as needed for severe pain. Patient not taking: Reported on 09/14/2015 04/29/15   Courteney Lyn Mackuen, MD   Meds Ordered and Administered this Visit  Medications - No data to display  BP (!) 165/97 (BP Location: Left Arm)   Pulse 68   Temp 97.5 F (36.4 C) (Oral)   Resp 16   SpO2 99%  No data found.   Physical Exam  Constitutional: She appears well-developed and well-nourished.  HENT:  Head: Normocephalic and atraumatic.  Nose: Nose normal.  Mouth/Throat: Oropharynx is clear and moist. No oropharyngeal exudate.  Left ear: Canal clear; TM pearly gray Right ear: Cerumen impaction noted  Eyes: EOM are normal. Pupils  are equal, round, and reactive to light.  Neck: Normal range of motion. Neck supple.  Cardiovascular: Normal rate, regular rhythm and normal heart sounds.   Pulmonary/Chest: Effort normal and breath sounds normal.  Abdominal: Soft. Bowel sounds are normal. There is no tenderness.  Neurological:  Patient is alert and oriented. Has negative romberg, Has normal gait. Muscle strength equally strong 5/5. Cranial nerves intact. Has no arm or leg drift. Has good coordination with nose-to-nose and finger-to-nose.   Skin: Skin is warm and dry.  Nursing note  and vitals reviewed.   Urgent Care Course     Procedures (including critical care time)  Labs Review Labs Reviewed - No data to display  Imaging Review No results found.  19:04: Right ear successfully irrigated; patient reports suddenly feels a lot better with all her symptoms resolving. She is not longer dizzy and reports her earache and tinnitus have also resolved.  MDM   1. Impacted cerumen of right ear    Right ear was successfully irrigated and patient reports that all her dizziness, earache, tinnitus has all resolved after the procedure. She reports feeling much better. Bp also improved at 158/85. Patient discharged home. Return as needed.     Lucia Estelle, NP 06/06/16 1913

## 2017-01-07 ENCOUNTER — Emergency Department (HOSPITAL_COMMUNITY)
Admission: EM | Admit: 2017-01-07 | Discharge: 2017-01-08 | Disposition: A | Discharge status: Home / Self Care | Payer: BLUE CROSS/BLUE SHIELD | Attending: Emergency Medicine | Admitting: Emergency Medicine

## 2017-01-07 ENCOUNTER — Encounter (HOSPITAL_COMMUNITY): Payer: Self-pay | Admitting: Emergency Medicine

## 2017-01-07 DIAGNOSIS — T7840XA Allergy, unspecified, initial encounter: Secondary | ICD-10-CM | POA: Diagnosis present

## 2017-01-07 DIAGNOSIS — Z79899 Other long term (current) drug therapy: Secondary | ICD-10-CM | POA: Diagnosis not present

## 2017-01-07 DIAGNOSIS — Y929 Unspecified place or not applicable: Secondary | ICD-10-CM | POA: Insufficient documentation

## 2017-01-07 DIAGNOSIS — X58XXXA Exposure to other specified factors, initial encounter: Secondary | ICD-10-CM | POA: Diagnosis not present

## 2017-01-07 DIAGNOSIS — Z87891 Personal history of nicotine dependence: Secondary | ICD-10-CM | POA: Insufficient documentation

## 2017-01-07 DIAGNOSIS — Z91010 Allergy to peanuts: Secondary | ICD-10-CM | POA: Diagnosis not present

## 2017-01-07 DIAGNOSIS — I10 Essential (primary) hypertension: Secondary | ICD-10-CM | POA: Diagnosis not present

## 2017-01-07 DIAGNOSIS — Y999 Unspecified external cause status: Secondary | ICD-10-CM | POA: Insufficient documentation

## 2017-01-07 DIAGNOSIS — Y939 Activity, unspecified: Secondary | ICD-10-CM | POA: Diagnosis not present

## 2017-01-07 MED ORDER — LORAZEPAM 2 MG/ML IJ SOLN
0.5000 mg | Freq: Once | INTRAMUSCULAR | Status: AC
  Administered 2017-01-07: 0.5 mg via INTRAVENOUS
  Filled 2017-01-07: qty 1

## 2017-01-07 MED ORDER — DIPHENHYDRAMINE HCL 25 MG PO TABS: 25.0000 mg | ORAL_TABLET | Freq: Four times a day (QID) | ORAL | 0 refills | Status: DC

## 2017-01-07 MED ORDER — FAMOTIDINE 20 MG PO TABS: 20.0000 mg | ORAL_TABLET | Freq: Two times a day (BID) | ORAL | 0 refills | Status: DC

## 2017-01-07 MED ORDER — FAMOTIDINE IN NACL 20-0.9 MG/50ML-% IV SOLN
20.0000 mg | Freq: Once | INTRAVENOUS | Status: AC
  Administered 2017-01-07: 20 mg via INTRAVENOUS
  Filled 2017-01-07: qty 50

## 2017-01-07 MED ORDER — DIPHENHYDRAMINE HCL 50 MG/ML IJ SOLN
25.0000 mg | Freq: Once | INTRAMUSCULAR | Status: AC
  Administered 2017-01-07: 25 mg via INTRAVENOUS
  Filled 2017-01-07: qty 1

## 2017-01-07 MED ORDER — SODIUM CHLORIDE 0.9 % IV SOLN
INTRAVENOUS | Status: DC
  Administered 2017-01-07: 22:00:00 via INTRAVENOUS

## 2017-01-07 MED ORDER — SODIUM CHLORIDE 0.9 % IV BOLUS (SEPSIS)
1000.0000 mL | Freq: Once | INTRAVENOUS | Status: AC
  Administered 2017-01-07: 1000 mL via INTRAVENOUS

## 2017-01-07 MED ORDER — EPINEPHRINE 0.3 MG/0.3ML IJ SOAJ: 0.3000 mg | Freq: Once | INTRAMUSCULAR | 0 refills | Status: AC

## 2017-01-07 MED ORDER — METHYLPREDNISOLONE SODIUM SUCC 125 MG IJ SOLR
125.0000 mg | Freq: Once | INTRAMUSCULAR | Status: AC
  Administered 2017-01-07: 125 mg via INTRAVENOUS
  Filled 2017-01-07: qty 2

## 2017-01-07 MED ORDER — PREDNISONE 50 MG PO TABS: 50.0000 mg | ORAL_TABLET | Freq: Every day | ORAL | 0 refills | Status: DC

## 2017-01-07 NOTE — ED Provider Notes (Signed)
Medical screening examination/treatment/procedure(s) were conducted as a shared visit with non-physician practitioner(s) and myself.  I personally evaluated the patient during the encounter.   EKG Interpretation  Date/Time:  Thursday January 07 2017 20:44:50 EST Ventricular Rate:  79 PR Interval:    QRS Duration: 81 QT Interval:  412 QTC Calculation: 473 R Axis:   68 Text Interpretation:  Sinus rhythm Confirmed by Lorre NickAllen, Beauford Lando ([id number]) on 01/07/2017 8:52:6409 PM     61 year old female here for allergic reaction likely to [place].  To control without relief.  Complains of diffuse pruritus as well as hives on her body.  She has no evidence of airway compromise.  Will treat with site Medrol and Pepcid here.  Will monitor likely discharge with epinephrine.   Lorre NickAllen, Carleen Rhue, MD 01/07/17 2052

## 2017-01-07 NOTE — ED Notes (Signed)
ED Provider at bedside. 

## 2017-01-07 NOTE — ED Provider Notes (Signed)
[facility]-EMERGENCY DEPT Provider Note   CSN: [id number] Arrival date & time: 01/07/17  2029     History   Chief Complaint Chief Complaint  Patient presents with  . Allergic Reaction    HPI Deborah Pratt is a 61 y.o. female with history of several food allergies who presents with diffuse itching and hives.  Patient reports her symptoms began after eating Thanksgiving meal cook by herself.  She is unsure what might have made her react.  She took one 25 mg capsule and took another 50 mg by opening the capsule under her tongue.  She reports her tongue has been numb since taking the Benadryl like this.  She denies swelling of her tongue or throat.  She denies any difficulty breathing.  She does note that the itching and hives have been improved since the Benadryl.  She denies any chest pain, abdominal pain, nausea, vomiting.  HPI  Past Medical History:  Diagnosis Date  . Allergy   . ANXIETY 11/11/2006   Qualifier: Diagnosis of  By: Jonny RuizJohn MD, Len BlalockJames W   . Asthma   . CHOLELITHIASIS 11/08/2006   Qualifier: Diagnosis of  By: Maris BergerSherwood, Elizabeth Ann   . DEPRESSION 11/11/2006   Qualifier: Diagnosis of  By: Jonny RuizJohn MD, Len BlalockJames W   . Generalized headaches   . Hyperlipidemia 10/03/2014  . HYPERTENSION 11/08/2006   Qualifier: Diagnosis of  By: Maris BergerSherwood, Elizabeth Ann   . Kidney stones   . Pulmonary embolism (HCC) 61 yo    on BCP  . UTI (lower urinary tract infection)     Patient Active Problem List   Diagnosis Date Noted  . Hyperlipidemia 10/03/2014  . Daytime somnolence 07/26/2014  . Snoring 03/03/2012  . Kidney stones   . Allergic rhinitis, cause unspecified   . Asthma   . Pulmonary embolism (HCC)   . Preventative health care 08/27/2011  . HYPERSOMNIA 04/16/2008  . ANXIETY 11/11/2006  . Depression 11/11/2006  . Essential hypertension 11/08/2006  . CHOLELITHIASIS 11/08/2006  . Headache(784.0) 11/08/2006    Past Surgical History:  Procedure  Laterality Date  . ABDOMINAL HYSTERECTOMY    . COLONOSCOPY    . NASAL SINUS SURGERY    . TUBAL LIGATION      OB History    No data available       Home Medications    Prior to Admission medications   Medication Sig Start Date End Date Taking? Authorizing Provider  diphenhydrAMINE (BENADRYL) 25 MG tablet Take 1 tablet (25 mg total) by mouth every 6 (six) hours. 01/07/17   Lofton Leon, Waylan BogaAlexandra M, PA-C  EPINEPHrine (EPIPEN 2-PAK) 0.3 mg/0.3 mL IJ SOAJ injection Inject 0.3 mLs (0.3 mg total) into the muscle once for 1 dose. 01/07/17 01/07/17  Emi HolesLaw, Angus Amini M, PA-C  famotidine (PEPCID) 20 MG tablet Take 1 tablet (20 mg total) by mouth 2 (two) times daily. 01/07/17   Emi HolesLaw, Tabetha Haraway M, PA-C  Multiple Vitamin (MULTIVITAMIN WITH MINERALS) TABS tablet Take 1 tablet by mouth daily.    [provider]  oxyCODONE (ROXICODONE) 5 MG immediate release tablet Take 1 tablet (5 mg total) by mouth every 6 (six) hours as needed for severe pain. Patient not taking: Reported on 09/14/2015 04/29/15   Abelino DerrickMackuen, Courteney Lyn, MD  predniSONE (DELTASONE) 50 MG tablet Take 1 tablet (50 mg total) by mouth daily with breakfast. 01/07/17   Shantai Tiedeman, Waylan BogaAlexandra M, PA-C    Family History Family History  Problem Relation Age of Onset  . Heart disease  Mother   . Gout Father   . Arthritis Other   . Arthritis Other   . Heart disease Other   . Stroke Other   . Hypertension Other     Social History Social History   Tobacco Use  . Smoking status: Former Smoker    Last attempt to quit: 02/15/2014    Years since quitting: 2.8  . Smokeless tobacco: Never Used  Substance Use Topics  . Alcohol use: Yes    Alcohol/week: 0.0 oz  . Drug use: No     Allergies   Other; Peanut-containing drug products; Shellfish allergy; Acetaminophen; Demerol [meperidine]; Fish oil; Latex; Morphine; Pork-derived products; and Tomato   Review of Systems Review of Systems  Constitutional: Negative for chills and fever.  HENT:  Negative for facial swelling, sore throat and trouble swallowing.   Respiratory: Negative for shortness of breath.   Cardiovascular: Negative for chest pain.  Gastrointestinal: Negative for abdominal pain, nausea and vomiting.  Genitourinary: Negative for dysuria.  Musculoskeletal: Negative for back pain.  Skin: Positive for rash (diffuse hives). Negative for wound.  Neurological: Negative for headaches.  Psychiatric/Behavioral: The patient is not nervous/anxious.      Physical Exam Updated Vital Signs BP 111/68   Pulse 90   Resp (!) 24   SpO2 94%   Physical Exam  Constitutional: She appears well-developed and well-nourished. No distress.  HENT:  Head: Normocephalic and atraumatic.  Mouth/Throat: Oropharynx is clear and moist. No oropharyngeal exudate.  No edema of the lips, tongue, posterior oropharynx, or face  Eyes: Conjunctivae are normal. Pupils are equal, round, and reactive to light. Right eye exhibits no discharge. Left eye exhibits no discharge. No scleral icterus.  Neck: Normal range of motion. Neck supple. No thyromegaly present.  Cardiovascular: Normal rate, regular rhythm, normal heart sounds and intact distal pulses. Exam reveals no gallop and no friction rub.  No murmur heard. Pulmonary/Chest: Effort normal and breath sounds normal. No stridor. No respiratory distress. She has no wheezes. She has no rales.  Abdominal: Soft. Bowel sounds are normal. She exhibits no distension. There is no tenderness. There is no rebound and no guarding.  Musculoskeletal: She exhibits no edema.  Lymphadenopathy:    She has no cervical adenopathy.  Neurological: She is alert. Coordination normal.  Skin: Skin is warm and dry. No rash noted. She is not diaphoretic. No pallor.  Diffuse, raised, hive-like reaction to arms and torso; nontender   Psychiatric: She has a normal mood and affect.  Nursing note and vitals reviewed.    ED Treatments / Results  Labs (all labs ordered are  listed, but only abnormal results are displayed) Labs Reviewed - No data to display  EKG  EKG Interpretation  Date/Time:  Thursday January 07 2017 20:44:50 EST Ventricular Rate:  79 PR Interval:    QRS Duration: 81 QT Interval:  412 QTC Calculation: 473 R Axis:   68 Text Interpretation:  Sinus rhythm Confirmed by Lorre Nick ([id number]) on 01/07/2017 8:52:09 PM       Radiology No results found.  Procedures Procedures (including critical care time)  Medications Ordered in ED Medications  sodium chloride 0.9 % bolus 1,000 mL (0 mLs Intravenous Stopped 01/07/17 2141)    And  0.9 %  sodium chloride infusion ( Intravenous New Bag/Given 01/07/17 2140)  methylPREDNISolone sodium succinate (SOLU-MEDROL) 125 mg/2 mL injection 125 mg (125 mg Intravenous Given 01/07/17 2047)  famotidine (PEPCID) IVPB 20 mg premix (0 mg Intravenous Stopped 01/07/17 2140)  diphenhydrAMINE (  BENADRYL) injection 25 mg (25 mg Intravenous Given 01/07/17 2112)  LORazepam (ATIVAN) injection 0.5 mg (0.5 mg Intravenous Given 01/07/17 2113)     Initial Impression / Assessment and Plan / ED Course  I have reviewed the triage vital signs and the nursing notes.  Pertinent labs & imaging results that were available during my care of the patient were reviewed by me and considered in my medical decision making (see chart for details).  Clinical Course as of Jan 07 2350  Thu Jan 07, 2017  2319 On re-evaluation, patient is sleeping comfortably. Hives and itching has resolved. Plan to finish fluids and discharge home.  [AL]    Clinical Course User Index [AL] Emi HolesLaw, Zedrick Springsteen M, PA-C    Patient with allergic reaction, most likely to fried [place].  Patient given Solu-Medrol 125 mg Benadryl, Pepcid, and fluid bolus.  She was observed for 3 hours and had complete resolution of symptoms.  No swelling of the lips, tongue, throat, shortness of breath.  Patient will be discharged home with short burst of prednisone, Pepcid,  Benadryl, and EpiPen.  Follow-up to PCP for recheck following today's visit.  Return precautions discussed.  Patient and husband understand and agree with plan.  Patient vitals stable throughout ED course and discharged in satisfactory condition.  Patient also evaluated by Dr. Freida BusmanAllen who guided the patient's management and agrees with plan.  Final Clinical Impressions(s) / ED Diagnoses   Final diagnoses:  Allergic reaction, initial encounter    ED Discharge Orders        Ordered    predniSONE (DELTASONE) 50 MG tablet  Daily with breakfast     01/07/17 2341    diphenhydrAMINE (BENADRYL) 25 MG tablet  Every 6 hours     01/07/17 2341    famotidine (PEPCID) 20 MG tablet  2 times daily     01/07/17 2341    EPINEPHrine (EPIPEN 2-PAK) 0.3 mg/0.3 mL IJ SOAJ injection   Once     01/07/17 2341       Emi HolesLaw, Zaki Gertsch M, PA-C 01/07/17 2352

## 2017-01-07 NOTE — Discharge Instructions (Signed)
Take Benadryl every 6 hours for the next 5-7 days.  Take Pepcid twice daily for the next 7 days.  Take prednisone once daily for the next 2 days.  Use EpiPen in a future allergic reaction that causes difficulty breathing, swelling of your lips, tongue, or throat, feeling like your airway is closing, abdominal pain, or vomiting.

## 2017-01-07 NOTE — ED Triage Notes (Addendum)
Patient c/o worsening allergic reaction since 1930. Hives and itching to entire body. Reports taking three 25mg  Benadryl. Denies throat swelling, SOB, facial swelling, and chest pain. Ambulatory. Speaking in full sentences without difficulty.

## 2019-06-09 ENCOUNTER — Ambulatory Visit: Payer: Self-pay | Attending: Internal Medicine

## 2019-06-09 DIAGNOSIS — Z23 Encounter for immunization: Secondary | ICD-10-CM

## 2019-06-09 NOTE — Progress Notes (Signed)
   Covid-19 Vaccination Clinic  Name:  FLORICE HINDLE    MRN: [id number] DOB: Feb 13, 1956  06/09/2019  Ms. Whitney-Taylor was observed post Covid-19 immunization for 15 minutes without incident. She was provided with Vaccine Information Sheet and instruction to access the V-Safe system.   Ms. Cichowski was instructed to call 911 with any severe reactions post vaccine: Marland Kitchen Difficulty breathing  . Swelling of face and throat  . A fast heartbeat  . A bad rash all over body  . Dizziness and weakness   Immunizations Administered    Name Date Dose VIS Date Route   Pfizer COVID-19 Vaccine 06/09/2019  3:52 PM 0.3 mL 04/12/2018 Intramuscular   Manufacturer: [employer], [employer]   Lot: [id number]   NDC: [address]-9

## 2021-02-12 ENCOUNTER — Observation Stay (HOSPITAL_COMMUNITY)
Admission: EM | Admit: 2021-02-12 | Discharge: 2021-02-15 | Disposition: A | Discharge status: Home / Self Care | Payer: BLUE CROSS/BLUE SHIELD | Attending: General Surgery | Admitting: General Surgery

## 2021-02-12 ENCOUNTER — Emergency Department (HOSPITAL_COMMUNITY): Payer: BLUE CROSS/BLUE SHIELD

## 2021-02-12 ENCOUNTER — Encounter (HOSPITAL_COMMUNITY): Payer: Self-pay

## 2021-02-12 ENCOUNTER — Ambulatory Visit (HOSPITAL_COMMUNITY): Admission: EM | Admit: 2021-02-12 | Discharge: 2021-02-12 | Discharge status: Against Medical Advice | Payer: Self-pay

## 2021-02-12 ENCOUNTER — Other Ambulatory Visit: Payer: Self-pay

## 2021-02-12 DIAGNOSIS — R1011 Right upper quadrant pain: Secondary | ICD-10-CM

## 2021-02-12 DIAGNOSIS — Z419 Encounter for procedure for purposes other than remedying health state, unspecified: Secondary | ICD-10-CM

## 2021-02-12 DIAGNOSIS — Z79899 Other long term (current) drug therapy: Secondary | ICD-10-CM | POA: Insufficient documentation

## 2021-02-12 DIAGNOSIS — Z87891 Personal history of nicotine dependence: Secondary | ICD-10-CM | POA: Insufficient documentation

## 2021-02-12 DIAGNOSIS — K8013 Calculus of gallbladder with acute and chronic cholecystitis with obstruction: Principal | ICD-10-CM | POA: Insufficient documentation

## 2021-02-12 DIAGNOSIS — Z20822 Contact with and (suspected) exposure to covid-19: Secondary | ICD-10-CM | POA: Diagnosis not present

## 2021-02-12 DIAGNOSIS — K819 Cholecystitis, unspecified: Secondary | ICD-10-CM

## 2021-02-12 DIAGNOSIS — I10 Essential (primary) hypertension: Secondary | ICD-10-CM | POA: Insufficient documentation

## 2021-02-12 DIAGNOSIS — K8010 Calculus of gallbladder with chronic cholecystitis without obstruction: Secondary | ICD-10-CM | POA: Diagnosis present

## 2021-02-12 LAB — URINALYSIS, ROUTINE W REFLEX MICROSCOPIC
Bacteria, UA: NONE SEEN
Bilirubin Urine: NEGATIVE
Glucose, UA: NEGATIVE mg/dL
Ketones, ur: NEGATIVE mg/dL
Leukocytes,Ua: NEGATIVE
Nitrite: NEGATIVE
Protein, ur: NEGATIVE mg/dL
Specific Gravity, Urine: 1.011 (ref 1.005–1.030)
pH: 6 (ref 5.0–8.0)

## 2021-02-12 LAB — COMPREHENSIVE METABOLIC PANEL
ALT: 28 U/L (ref 0–44)
AST: 26 U/L (ref 15–41)
Albumin: 4 g/dL (ref 3.5–5.0)
Alkaline Phosphatase: 146 U/L — ABNORMAL HIGH (ref 38–126)
Anion gap: 8 (ref 5–15)
BUN: 10 mg/dL (ref 8–23)
CO2: 22 mmol/L (ref 22–32)
Calcium: 9 mg/dL (ref 8.9–10.3)
Chloride: 105 mmol/L (ref 98–111)
Creatinine, Ser: 0.82 mg/dL (ref 0.44–1.00)
GFR, Estimated: >60 mL/min (ref >60)
Glucose, Bld: 105 mg/dL — ABNORMAL HIGH (ref 70–99)
Potassium: 3.3 mmol/L — ABNORMAL LOW (ref 3.5–5.1)
Sodium: 135 mmol/L (ref 135–145)
Total Bilirubin: 0.7 mg/dL (ref 0.3–1.2)
Total Protein: 7.7 g/dL (ref 6.5–8.1)

## 2021-02-12 LAB — LIPASE, BLOOD: Lipase: 28 U/L (ref 11–51)

## 2021-02-12 LAB — TROPONIN I (HIGH SENSITIVITY)
Troponin I (High Sensitivity): 3 ng/L (ref <18)
Troponin I (High Sensitivity): 4 ng/L (ref <18)

## 2021-02-12 LAB — CBC
HCT: 39.1 % (ref 36.0–46.0)
Hemoglobin: 12.7 g/dL (ref 12.0–15.0)
MCH: 30.8 pg (ref 26.0–34.0)
MCHC: 32.5 g/dL (ref 30.0–36.0)
MCV: 94.7 fL (ref 80.0–100.0)
Platelets: 224 10*3/uL (ref 150–400)
RBC: 4.13 MIL/uL (ref 3.87–5.11)
RDW: 12.8 % (ref 11.5–15.5)
WBC: 6.8 10*3/uL (ref 4.0–10.5)
nRBC: 0 % (ref 0.0–0.2)

## 2021-02-12 MED ORDER — ONDANSETRON HCL 4 MG/2ML IJ SOLN
4.0000 mg | Freq: Once | INTRAMUSCULAR | Status: AC
  Administered 2021-02-12: 4 mg via INTRAVENOUS
  Filled 2021-02-12: qty 2

## 2021-02-12 MED ORDER — ONDANSETRON 4 MG PO TBDP
4.0000 mg | ORAL_TABLET | Freq: Once | ORAL | Status: AC
  Administered 2021-02-12: 19:00:00 4 mg via ORAL
  Filled 2021-02-12: qty 1

## 2021-02-12 MED ORDER — HYDROMORPHONE HCL 1 MG/ML IJ SOLN
0.5000 mg | Freq: Once | INTRAMUSCULAR | Status: AC
  Administered 2021-02-12: 0.5 mg via INTRAVENOUS
  Filled 2021-02-12: qty 1

## 2021-02-12 MED ORDER — SODIUM CHLORIDE 0.9 % IV SOLN
2.0000 g | Freq: Once | INTRAVENOUS | Status: AC
  Administered 2021-02-12: 2 g via INTRAVENOUS
  Filled 2021-02-12: qty 20

## 2021-02-12 NOTE — ED Triage Notes (Signed)
Pt reports upper CP radiating down to upper abdomen since 0100 this morning. Pt seen at urgent care and sent here. No relief with pepto bismol

## 2021-02-12 NOTE — ED Notes (Signed)
Labeled specimen cup provided to pt for urine collection per MD order. ENMiles 

## 2021-02-12 NOTE — ED Provider Notes (Signed)
[facility] DEPT Provider Note   CSN: [id number] Arrival date & time: 02/12/21  1732     History Chief Complaint  Patient presents with   Abdominal Pain    Deborah Pratt is a 65 y.o. female.  The history is provided by the patient and medical records.  Abdominal Pain Associated symptoms: nausea and vomiting    65 year old female with history of asthma, anxiety, hyperlipidemia, hypertension, remote history of PE but no longer requiring anticoagulation, presenting to the ED with upper abdominal pain.  States she woke up around 1 AM with heartburn type symptoms, this resolved after drinking some milk.  Woke up few hours later with worsening pain in her upper abdomen.  Admits to several episodes of nausea and vomiting, tried Pepto-Bismol and eating some toast without relief.  She ate some boiled eggs around lunchtime which acutely worsened her pain.  She has never had issues like this before.  She does have history of gallstones.  Denies prior abdominal surgeries.  She was given Zofran in triage which has stopped her nausea.  Past Medical History:  Diagnosis Date   Allergy    ANXIETY 11/11/2006   Qualifier: Diagnosis of  By: Jenny Reichmann MD, Hunt Oris    Asthma    CHOLELITHIASIS 11/08/2006   Qualifier: Diagnosis of  By: Elveria Royals    DEPRESSION 11/11/2006   Qualifier: Diagnosis of  By: Jenny Reichmann MD, Hunt Oris    Generalized headaches    Hyperlipidemia 10/03/2014   HYPERTENSION 11/08/2006   Qualifier: Diagnosis of  By: Elveria Royals    Kidney stones    Pulmonary embolism ([place]) 65 yo    on BCP   UTI (lower urinary tract infection)     Patient Active Problem List   Diagnosis Date Noted   Hyperlipidemia 10/03/2014   Daytime somnolence 07/26/2014   Snoring 03/03/2012   Kidney stones    Allergic rhinitis, cause unspecified    Asthma    Pulmonary embolism ([place])    Preventative health care 08/27/2011   HYPERSOMNIA 04/16/2008    ANXIETY 11/11/2006   Depression 11/11/2006   Essential hypertension 11/08/2006   Cholelithiasis with cholecystitis 11/08/2006   Headache(784.0) 11/08/2006    Past Surgical History:  Procedure Laterality Date   ABDOMINAL HYSTERECTOMY     COLONOSCOPY     NASAL SINUS SURGERY     TUBAL LIGATION       OB History   No obstetric history on file.     Family History  Problem Relation Age of Onset   Heart disease Mother    Gout Father    Arthritis Other    Arthritis Other    Heart disease Other    Stroke Other    Hypertension Other     Social History   Tobacco Use   Smoking status: Former    Types: Cigarettes    Quit date: 02/15/2014    Years since quitting: 7.0   Smokeless tobacco: Never  Substance Use Topics   Alcohol use: Yes    Comment: occasionally   Drug use: No    Home Medications Prior to Admission medications   Medication Sig Start Date End Date Taking? Authorizing Provider  diphenhydrAMINE (BENADRYL) 25 MG tablet Take 1 tablet (25 mg total) by mouth every 6 (six) hours. Patient not taking: Reported on 02/12/2021 01/07/17   Frederica Kuster, PA-C  famotidine (PEPCID) 20 MG tablet Take 1 tablet (20 mg total) by mouth 2 (two) times daily. Patient  not taking: Reported on 02/12/2021 01/07/17   Frederica Kuster, PA-C  oxyCODONE (ROXICODONE) 5 MG immediate release tablet Take 1 tablet (5 mg total) by mouth every 6 (six) hours as needed for severe pain. Patient not taking: Reported on 09/14/2015 04/29/15   Macarthur Critchley, MD  predniSONE (DELTASONE) 50 MG tablet Take 1 tablet (50 mg total) by mouth daily with breakfast. Patient not taking: Reported on 02/12/2021 01/07/17   Frederica Kuster, PA-C    Allergies    Other, Peanut-containing drug products, Shellfish allergy, Acetaminophen, Demerol [meperidine], Fish oil, Latex, Morphine, Pork-derived products, and Tomato  Review of Systems   Review of Systems  Gastrointestinal:  Positive for abdominal pain,  nausea and vomiting.  All other systems reviewed and are negative.  Physical Exam Updated Vital Signs BP 118/63    Pulse 88    Temp 98.2 F (36.8 C) (Oral)    Resp 18    Ht _0  (1.727 m)    Wt 82.6 kg    SpO2 100%    BMI 27.67 kg/m   Physical Exam Vitals and nursing note reviewed.  Constitutional:      Appearance: She is well-developed.  HENT:     Head: Normocephalic and atraumatic.  Eyes:     Conjunctiva/sclera: Conjunctivae normal.     Pupils: Pupils are equal, round, and reactive to light.  Cardiovascular:     Rate and Rhythm: Normal rate and regular rhythm.     Heart sounds: Normal heart sounds.  Pulmonary:     Effort: Pulmonary effort is normal.     Breath sounds: Normal breath sounds.  Abdominal:     General: Bowel sounds are normal.     Palpations: Abdomen is soft.     Tenderness: There is abdominal tenderness in the right upper quadrant and epigastric area. Positive signs include Murphy's sign.  Musculoskeletal:        General: Normal range of motion.     Cervical back: Normal range of motion.  Skin:    General: Skin is warm and dry.  Neurological:     Mental Status: She is alert and oriented to person, place, and time.    ED Results / Procedures / Treatments   Labs (all labs ordered are listed, but only abnormal results are displayed) Labs Reviewed  COMPREHENSIVE METABOLIC PANEL - Abnormal; Notable for the following components:      Result Value   Potassium 3.3 (*)    Glucose, Bld 105 (*)    Alkaline Phosphatase 146 (*)    All other components within normal limits  URINALYSIS, ROUTINE W REFLEX MICROSCOPIC - Abnormal; Notable for the following components:   APPearance HAZY (*)    Hgb urine dipstick SMALL (*)    All other components within normal limits  RESP PANEL BY RT-PCR (FLU A&B, COVID) ARPGX2  LIPASE, BLOOD  CBC  TROPONIN I (HIGH SENSITIVITY)  TROPONIN I (HIGH SENSITIVITY)    EKG None  Radiology DG Chest 2 View  Result Date:  02/12/2021 CLINICAL DATA:  Chest pain radiating to the upper abdomen. EXAM: CHEST - 2 VIEW COMPARISON:  06/15/2013 FINDINGS: The heart size and mediastinal contours are within normal limits. Both lungs are clear. The visualized skeletal structures are unremarkable. IMPRESSION: No active cardiopulmonary disease. Electronically Signed   By: Lucienne Capers M.D.   On: 02/12/2021 19:44   US Abdomen Limited RUQ (LIVER/GB)  Result Date: 02/12/2021 CLINICAL DATA:  Right upper quadrant pain EXAM: ULTRASOUND ABDOMEN LIMITED RIGHT  UPPER QUADRANT COMPARISON:  CT 06/30/2006 FINDINGS: Gallbladder: Several stones in the gallbladder, largest measuring 9 mm in diameter and located in the gallbladder neck. Gallbladder sludge is present. Murphy's sign is positive. Echogenic foci in the gallbladder wall with ring down artifact consistent with cholesterolosis. Common bile duct: Diameter: 6 mm, normal Liver: No focal lesion identified. Within normal limits in parenchymal echogenicity. Portal vein is patent on color Doppler imaging with normal direction of blood flow towards the liver. Other: None. IMPRESSION: 1. Cholelithiasis with gallbladder sludge and positive Murphy's sign. Changes suggest acute cholecystitis in the appropriate clinical setting. 2. Findings of gallbladder cholesterolosis. Electronically Signed   By: Lucienne Capers M.D.   On: 02/12/2021 19:43    Procedures Procedures   Medications Ordered in ED Medications  ondansetron (ZOFRAN-ODT) disintegrating tablet 4 mg (4 mg Oral Given 02/12/21 1855)  HYDROmorphone (DILAUDID) injection 0.5 mg (0.5 mg Intravenous Given 02/12/21 2339)  ondansetron (ZOFRAN) injection 4 mg (4 mg Intravenous Given 02/12/21 2340)  cefTRIAXone (ROCEPHIN) 2 g in sodium chloride 0.9 % 100 mL IVPB (2 g Intravenous New Bag/Given 02/12/21 2341)    ED Course  I have reviewed the triage vital signs and the nursing notes.  Pertinent labs & imaging results that were available during my  care of the patient were reviewed by me and considered in my medical decision making (see chart for details).    MDM Rules/Calculators/A&P                         65 year old female presenting to the ED with epigastric pain since early this.  Worsening after eating some boiled eggs.  Has used over-the-counter Pepto at home without relief.  She is afebrile and nontoxic but does appear uncomfortable.  She does have tenderness in the epigastrium and right upper quadrant with positive Murphy sign.  Work-up today with normal WBC count, normal LFT's, alk phos elevated but appears around her baseline compared with prior values.  [name] with findings of acute cholecystitis.  She was given pain and nausea medication along with Rocephin for antibiotic prophylaxis.  Discussed with general surgery, Dr. Harlow Asa-- will admit for ongoing care.  Final Clinical Impression(s) / ED Diagnoses Final diagnoses:  RUQ pain  Cholecystitis    Rx / DC Orders ED Discharge Orders     None        Larene Pickett, PA-C 02/13/21 0014    Luna Fuse, MD 02/13/21 [phone]

## 2021-02-12 NOTE — ED Provider Notes (Signed)
Emergency Medicine Provider Triage Evaluation Note  Deborah Pratt , a 65 y.o. female  was evaluated in triage.  Pt complains of upper abdominal pain onset 1 AM this morning.  Has associated nausea, vomiting, right upper/sternal chest pain.  Tried Pepto-Bismol with no relief of her symptoms.  Denies shortness of breath, fever, chills, dysuria, hematuria.  Denies history of MI, CAD, cardiac catheterization.  Patient reports she still has her gallbladder. Review of Systems  Positive: Upper abdominal pain, chest pain Negative: Fever, chills  Physical Exam  BP (!) 160/110 (BP Location: Left Arm)    Pulse 93    Temp 98.2 F (36.8 C) (Oral)    Resp 20    Ht 5\' 8"  (1.727 m)    Wt 82.6 kg    SpO2 100%    BMI 27.67 kg/m  Gen:   Awake, no distress   Resp:  Normal effort  MSK:   Moves extremities without difficulty  Other:  Right-sided/sternal chest wall tenderness to palpation.  Positive Murphy sign on exam.  No overlying skin changes.  Medical Decision Making  Medically screening exam initiated at 6:44 PM.  Appropriate orders placed.  Chantavia Bazzle Whitney-Taylor was informed that the remainder of the evaluation will be completed by another provider, this initial triage assessment does not replace that evaluation, and the importance of remaining in the ED until their evaluation is complete.   Diesha Rostad A, PA-C 02/12/21 02/14/21    Avon Gully, MD 02/13/21 [phone]

## 2021-02-13 ENCOUNTER — Observation Stay (HOSPITAL_COMMUNITY): Payer: BLUE CROSS/BLUE SHIELD

## 2021-02-13 ENCOUNTER — Observation Stay (HOSPITAL_COMMUNITY): Payer: BLUE CROSS/BLUE SHIELD | Admitting: Certified Registered Nurse Anesthetist

## 2021-02-13 ENCOUNTER — Encounter (HOSPITAL_COMMUNITY): Payer: Self-pay | Admitting: Surgery

## 2021-02-13 ENCOUNTER — Other Ambulatory Visit: Payer: Self-pay

## 2021-02-13 ENCOUNTER — Encounter (HOSPITAL_COMMUNITY)
Admission: EM | Disposition: A | Discharge status: Home / Self Care | Payer: Self-pay | Source: Home / Self Care | Attending: Emergency Medicine

## 2021-02-13 DIAGNOSIS — K8010 Calculus of gallbladder with chronic cholecystitis without obstruction: Secondary | ICD-10-CM

## 2021-02-13 HISTORY — PX: LAPAROSCOPIC CHOLECYSTECTOMY SINGLE SITE WITH INTRAOPERATIVE CHOLANGIOGRAM: SHX6538

## 2021-02-13 HISTORY — DX: Calculus of gallbladder with chronic cholecystitis without obstruction: K80.10

## 2021-02-13 LAB — HIV ANTIBODY (ROUTINE TESTING W REFLEX): HIV Screen 4th Generation wRfx: NONREACTIVE

## 2021-02-13 LAB — SURGICAL PCR SCREEN
MRSA, PCR: NEGATIVE
Staphylococcus aureus: POSITIVE — AB

## 2021-02-13 LAB — RESP PANEL BY RT-PCR (FLU A&B, COVID) ARPGX2
Influenza A by PCR: NEGATIVE
Influenza B by PCR: NEGATIVE
SARS Coronavirus 2 by RT PCR: NEGATIVE

## 2021-02-13 SURGERY — LAPAROSCOPIC CHOLECYSTECTOMY SINGLE SITE WITH INTRAOPERATIVE CHOLANGIOGRAM
Anesthesia: General | Site: Abdomen

## 2021-02-13 MED ORDER — SODIUM CHLORIDE 0.9% FLUSH: 3.0000 mL | INTRAVENOUS | Status: DC | PRN

## 2021-02-13 MED ORDER — ENSURE PRE-SURGERY PO LIQD: 296.0000 mL | Freq: Once | ORAL | Status: DC

## 2021-02-13 MED ORDER — IBUPROFEN 400 MG PO TABS
400.0000 mg | ORAL_TABLET | Freq: Four times a day (QID) | ORAL | Status: DC | PRN
  Administered 2021-02-13: 22:00:00 800 mg via ORAL
  Filled 2021-02-13: qty 2

## 2021-02-13 MED ORDER — ACETAMINOPHEN 650 MG RE SUPP: 650.0000 mg | Freq: Four times a day (QID) | RECTAL | Status: DC | PRN

## 2021-02-13 MED ORDER — BUPIVACAINE LIPOSOME 1.3 % IJ SUSP
INTRAMUSCULAR | Status: AC
  Filled 2021-02-13: qty 20

## 2021-02-13 MED ORDER — ALUM & MAG HYDROXIDE-SIMETH 200-200-20 MG/5ML PO SUSP: 30.0000 mL | Freq: Four times a day (QID) | ORAL | Status: DC | PRN

## 2021-02-13 MED ORDER — KETOROLAC TROMETHAMINE 30 MG/ML IJ SOLN
INTRAMUSCULAR | Status: DC | PRN
  Administered 2021-02-13: 30 mg via INTRAVENOUS

## 2021-02-13 MED ORDER — ONDANSETRON 4 MG PO TBDP: 4.0000 mg | ORAL_TABLET | Freq: Four times a day (QID) | ORAL | Status: DC | PRN

## 2021-02-13 MED ORDER — CHLORHEXIDINE GLUCONATE CLOTH 2 % EX PADS
6.0000 | MEDICATED_PAD | Freq: Every day | CUTANEOUS | Status: DC
  Administered 2021-02-14 – 2021-02-15 (×2): 6 via TOPICAL

## 2021-02-13 MED ORDER — MIDAZOLAM HCL 5 MG/5ML IJ SOLN
INTRAMUSCULAR | Status: DC | PRN
  Administered 2021-02-13: 2 mg via INTRAVENOUS

## 2021-02-13 MED ORDER — ROCURONIUM BROMIDE 10 MG/ML (PF) SYRINGE
PREFILLED_SYRINGE | INTRAVENOUS | Status: AC
  Filled 2021-02-13: qty 10

## 2021-02-13 MED ORDER — BUPIVACAINE LIPOSOME 1.3 % IJ SUSP
INTRAMUSCULAR | Status: DC | PRN
  Administered 2021-02-13: 20 mL

## 2021-02-13 MED ORDER — BUPIVACAINE-EPINEPHRINE (PF) 0.25% -1:200000 IJ SOLN
INTRAMUSCULAR | Status: AC
  Filled 2021-02-13: qty 30

## 2021-02-13 MED ORDER — DEXAMETHASONE SODIUM PHOSPHATE 4 MG/ML IJ SOLN
INTRAMUSCULAR | Status: DC | PRN
Start: 2021-02-13 — End: 2021-02-13
  Administered 2021-02-13: 10 mg via INTRAVENOUS

## 2021-02-13 MED ORDER — CHLORHEXIDINE GLUCONATE CLOTH 2 % EX PADS
6.0000 | MEDICATED_PAD | Freq: Once | CUTANEOUS | Status: AC
  Administered 2021-02-13: 22:00:00 6 via TOPICAL

## 2021-02-13 MED ORDER — FENTANYL CITRATE (PF) 100 MCG/2ML IJ SOLN
INTRAMUSCULAR | Status: DC | PRN
  Administered 2021-02-13 (×4): 50 ug via INTRAVENOUS

## 2021-02-13 MED ORDER — MUPIROCIN 2 % EX OINT
1.0000 "application " | TOPICAL_OINTMENT | Freq: Two times a day (BID) | CUTANEOUS | Status: DC
  Administered 2021-02-14: 1 via NASAL
  Filled 2021-02-13 (×2): qty 22

## 2021-02-13 MED ORDER — SODIUM CHLORIDE 0.9% FLUSH
3.0000 mL | Freq: Two times a day (BID) | INTRAVENOUS | Status: DC
  Administered 2021-02-13 – 2021-02-14 (×3): 3 mL via INTRAVENOUS

## 2021-02-13 MED ORDER — OXYCODONE HCL 5 MG PO TABS: 5.0000 mg | ORAL_TABLET | Freq: Once | ORAL | Status: DC | PRN

## 2021-02-13 MED ORDER — ROCURONIUM BROMIDE 10 MG/ML (PF) SYRINGE
PREFILLED_SYRINGE | INTRAVENOUS | Status: DC | PRN
  Administered 2021-02-13: 70 mg via INTRAVENOUS

## 2021-02-13 MED ORDER — BUPIVACAINE LIPOSOME 1.3 % IJ SUSP: 20.0000 mL | Freq: Once | INTRAMUSCULAR | Status: DC

## 2021-02-13 MED ORDER — OXYCODONE HCL 5 MG/5ML PO SOLN: 5.0000 mg | Freq: Once | ORAL | Status: DC | PRN

## 2021-02-13 MED ORDER — FENTANYL CITRATE (PF) 100 MCG/2ML IJ SOLN
INTRAMUSCULAR | Status: AC
  Filled 2021-02-13: qty 2

## 2021-02-13 MED ORDER — PROPOFOL 10 MG/ML IV BOLUS
INTRAVENOUS | Status: DC | PRN
  Administered 2021-02-13: 120 mg via INTRAVENOUS

## 2021-02-13 MED ORDER — FENTANYL CITRATE PF 50 MCG/ML IJ SOSY: 25.0000 ug | PREFILLED_SYRINGE | INTRAMUSCULAR | Status: DC | PRN

## 2021-02-13 MED ORDER — PROPOFOL 10 MG/ML IV BOLUS
INTRAVENOUS | Status: AC
  Filled 2021-02-13: qty 20

## 2021-02-13 MED ORDER — ONDANSETRON HCL 4 MG/2ML IJ SOLN
INTRAMUSCULAR | Status: DC | PRN
  Administered 2021-02-13: 4 mg via INTRAVENOUS

## 2021-02-13 MED ORDER — LIDOCAINE HCL (PF) 2 % IJ SOLN
INTRAMUSCULAR | Status: AC
  Filled 2021-02-13: qty 5

## 2021-02-13 MED ORDER — 0.9 % SODIUM CHLORIDE (POUR BTL) OPTIME
TOPICAL | Status: DC | PRN
  Administered 2021-02-13: 11:00:00 1000 mL

## 2021-02-13 MED ORDER — GABAPENTIN 300 MG PO CAPS
300.0000 mg | ORAL_CAPSULE | ORAL | Status: AC
  Administered 2021-02-13: 09:00:00 300 mg via ORAL
  Filled 2021-02-13: qty 1

## 2021-02-13 MED ORDER — SODIUM CHLORIDE (PF) 0.9 % IJ SOLN
INTRAMUSCULAR | Status: DC | PRN
  Administered 2021-02-13: 11:00:00 20 mL

## 2021-02-13 MED ORDER — MIDAZOLAM HCL 2 MG/2ML IJ SOLN
INTRAMUSCULAR | Status: AC
  Filled 2021-02-13: qty 2

## 2021-02-13 MED ORDER — ACETAMINOPHEN 325 MG PO TABS
650.0000 mg | ORAL_TABLET | Freq: Four times a day (QID) | ORAL | Status: DC | PRN
  Filled 2021-02-13: qty 2

## 2021-02-13 MED ORDER — OXYCODONE HCL 5 MG PO TABS
5.0000 mg | ORAL_TABLET | ORAL | Status: DC | PRN
  Administered 2021-02-13 – 2021-02-14 (×2): 5 mg via ORAL
  Filled 2021-02-13 (×2): qty 1

## 2021-02-13 MED ORDER — FAMOTIDINE 20 MG PO TABS
20.0000 mg | ORAL_TABLET | Freq: Two times a day (BID) | ORAL | Status: DC
  Administered 2021-02-13 – 2021-02-15 (×4): 20 mg via ORAL
  Filled 2021-02-13 (×4): qty 1

## 2021-02-13 MED ORDER — SODIUM CHLORIDE 0.9 % IV SOLN
INTRAVENOUS | Status: AC
  Filled 2021-02-13: qty 20

## 2021-02-13 MED ORDER — LIDOCAINE 2% (20 MG/ML) 5 ML SYRINGE
INTRAMUSCULAR | Status: DC | PRN
  Administered 2021-02-13: 60 mg via INTRAVENOUS

## 2021-02-13 MED ORDER — METHOCARBAMOL 500 MG PO TABS
1000.0000 mg | ORAL_TABLET | Freq: Four times a day (QID) | ORAL | Status: DC | PRN
  Administered 2021-02-14: 04:00:00 500 mg via ORAL
  Filled 2021-02-13: qty 2

## 2021-02-13 MED ORDER — LACTATED RINGERS IV SOLN: INTRAVENOUS | Status: DC | PRN

## 2021-02-13 MED ORDER — SIMETHICONE 40 MG/0.6ML PO SUSP
80.0000 mg | Freq: Four times a day (QID) | ORAL | Status: DC | PRN
  Filled 2021-02-13: qty 1.2

## 2021-02-13 MED ORDER — PHENYLEPHRINE 40 MCG/ML (10ML) SYRINGE FOR IV PUSH (FOR BLOOD PRESSURE SUPPORT)
PREFILLED_SYRINGE | INTRAVENOUS | Status: AC
  Filled 2021-02-13: qty 10

## 2021-02-13 MED ORDER — LACTATED RINGERS IR SOLN
Status: DC | PRN
  Administered 2021-02-13: 2000 mL

## 2021-02-13 MED ORDER — KETOROLAC TROMETHAMINE 30 MG/ML IJ SOLN
INTRAMUSCULAR | Status: AC
  Filled 2021-02-13: qty 1

## 2021-02-13 MED ORDER — METHOCARBAMOL 1000 MG/10ML IJ SOLN
1000.0000 mg | Freq: Four times a day (QID) | INTRAVENOUS | Status: DC | PRN
  Filled 2021-02-13: qty 10

## 2021-02-13 MED ORDER — SUGAMMADEX SODIUM 200 MG/2ML IV SOLN
INTRAVENOUS | Status: DC | PRN
Start: 2021-02-13 — End: 2021-02-13
  Administered 2021-02-13: 200 mg via INTRAVENOUS

## 2021-02-13 MED ORDER — PHENYLEPHRINE 40 MCG/ML (10ML) SYRINGE FOR IV PUSH (FOR BLOOD PRESSURE SUPPORT)
PREFILLED_SYRINGE | INTRAVENOUS | Status: DC | PRN
  Administered 2021-02-13 (×3): 120 ug via INTRAVENOUS

## 2021-02-13 MED ORDER — KCL IN DEXTROSE-NACL 20-5-0.45 MEQ/L-%-% IV SOLN
INTRAVENOUS | Status: DC
  Filled 2021-02-13 (×4): qty 1000

## 2021-02-13 MED ORDER — PROCHLORPERAZINE EDISYLATE 10 MG/2ML IJ SOLN
5.0000 mg | INTRAMUSCULAR | Status: DC | PRN
Start: 2021-02-13 — End: 2021-02-15

## 2021-02-13 MED ORDER — LIP MEDEX EX OINT
1.0000 "application " | TOPICAL_OINTMENT | Freq: Two times a day (BID) | CUTANEOUS | Status: DC
  Administered 2021-02-13 – 2021-02-15 (×4): 1 via TOPICAL
  Filled 2021-02-13: qty 7

## 2021-02-13 MED ORDER — MENTHOL 3 MG MT LOZG: 1.0000 | LOZENGE | OROMUCOSAL | Status: DC | PRN

## 2021-02-13 MED ORDER — DIPHENHYDRAMINE HCL 50 MG/ML IJ SOLN: 12.5000 mg | Freq: Four times a day (QID) | INTRAMUSCULAR | Status: DC | PRN

## 2021-02-13 MED ORDER — SODIUM CHLORIDE 0.9 % IV SOLN: 250.0000 mL | INTRAVENOUS | Status: DC | PRN

## 2021-02-13 MED ORDER — DEXAMETHASONE SODIUM PHOSPHATE 10 MG/ML IJ SOLN
INTRAMUSCULAR | Status: AC
  Filled 2021-02-13: qty 1

## 2021-02-13 MED ORDER — LACTATED RINGERS IV BOLUS: 1000.0000 mL | Freq: Three times a day (TID) | INTRAVENOUS | Status: DC | PRN

## 2021-02-13 MED ORDER — ONDANSETRON HCL 4 MG/2ML IJ SOLN: 4.0000 mg | Freq: Four times a day (QID) | INTRAMUSCULAR | Status: DC | PRN

## 2021-02-13 MED ORDER — CALCIUM POLYCARBOPHIL 625 MG PO TABS
625.0000 mg | ORAL_TABLET | Freq: Two times a day (BID) | ORAL | Status: DC
  Administered 2021-02-13 – 2021-02-15 (×4): 625 mg via ORAL
  Filled 2021-02-13 (×4): qty 1

## 2021-02-13 MED ORDER — DEXTROSE 5 % IV SOLN
INTRAVENOUS | Status: DC | PRN
  Administered 2021-02-13: 10:00:00 2 g via INTRAVENOUS

## 2021-02-13 MED ORDER — BUPIVACAINE-EPINEPHRINE 0.25% -1:200000 IJ SOLN
INTRAMUSCULAR | Status: DC | PRN
  Administered 2021-02-13: 30 mL

## 2021-02-13 MED ORDER — ONDANSETRON HCL 4 MG/2ML IJ SOLN
INTRAMUSCULAR | Status: AC
  Filled 2021-02-13: qty 2

## 2021-02-13 MED ORDER — STERILE WATER FOR IRRIGATION IR SOLN
Status: DC | PRN
  Administered 2021-02-13: 1000 mL

## 2021-02-13 MED ORDER — KETOROLAC TROMETHAMINE 15 MG/ML IJ SOLN
15.0000 mg | Freq: Four times a day (QID) | INTRAMUSCULAR | Status: DC | PRN
  Administered 2021-02-14: 06:00:00 15 mg via INTRAVENOUS
  Filled 2021-02-13: qty 1

## 2021-02-13 MED ORDER — PHENOL 1.4 % MT LIQD: 2.0000 | OROMUCOSAL | Status: DC | PRN

## 2021-02-13 MED ORDER — CHLORHEXIDINE GLUCONATE CLOTH 2 % EX PADS
6.0000 | MEDICATED_PAD | Freq: Once | CUTANEOUS | Status: AC
  Administered 2021-02-13: 09:00:00 6 via TOPICAL

## 2021-02-13 MED ORDER — MAGIC MOUTHWASH
15.0000 mL | Freq: Four times a day (QID) | ORAL | Status: DC | PRN
  Filled 2021-02-13: qty 15

## 2021-02-13 MED ORDER — HYDROMORPHONE HCL 1 MG/ML IJ SOLN: 1.0000 mg | INTRAMUSCULAR | Status: DC | PRN

## 2021-02-13 SURGICAL SUPPLY — 42 items
APPLIER CLIP 5 13 M/L LIGAMAX5 (MISCELLANEOUS) ×2
BAG COUNTER SPONGE SURGICOUNT (BAG) IMPLANT
CABLE HIGH FREQUENCY MONO STRZ (ELECTRODE) ×2 IMPLANT
CHLORAPREP W/TINT 26 (MISCELLANEOUS) ×2 IMPLANT
CLIP APPLIE 5 13 M/L LIGAMAX5 (MISCELLANEOUS) ×1 IMPLANT
COVER MAYO STAND STRL (DRAPES) ×1 IMPLANT
COVER SURGICAL LIGHT HANDLE (MISCELLANEOUS) ×2 IMPLANT
DECANTER SPIKE VIAL GLASS SM (MISCELLANEOUS) ×1 IMPLANT
DRAIN CHANNEL 19F RND (DRAIN) IMPLANT
DRAPE C-ARM 42X120 X-RAY (DRAPES) ×2 IMPLANT
DRAPE WARM FLUID 44X44 (DRAPES) ×2 IMPLANT
DRSG TEGADERM 4X4.75 (GAUZE/BANDAGES/DRESSINGS) ×2 IMPLANT
ELECT REM PT RETURN 15FT ADLT (MISCELLANEOUS) ×2 IMPLANT
ENDOLOOP SUT PDS II  0 18 (SUTURE)
ENDOLOOP SUT PDS II 0 18 (SUTURE) IMPLANT
EVACUATOR SILICONE 100CC (DRAIN) IMPLANT
GAUZE SPONGE 2X2 8PLY STRL LF (GAUZE/BANDAGES/DRESSINGS) ×1 IMPLANT
GLOVE SURG NEOPR MICRO LF SZ8 (GLOVE) ×2 IMPLANT
GLOVE SURG UNDER LTX SZ8 (GLOVE) ×2 IMPLANT
GOWN STRL REUS W/TWL XL LVL3 (GOWN DISPOSABLE) ×6 IMPLANT
IRRIG SUCT STRYKERFLOW 2 WTIP (MISCELLANEOUS) ×2
IRRIGATION SUCT STRKRFLW 2 WTP (MISCELLANEOUS) ×1 IMPLANT
KIT BASIN OR (CUSTOM PROCEDURE TRAY) ×2 IMPLANT
KIT TURNOVER KIT A (KITS) IMPLANT
PAD POSITIONING PINK XL (MISCELLANEOUS) ×2 IMPLANT
PENCIL SMOKE EVACUATOR (MISCELLANEOUS) IMPLANT
POUCH RETRIEVAL ECOSAC 10 (ENDOMECHANICALS) IMPLANT
POUCH RETRIEVAL ECOSAC 10MM (ENDOMECHANICALS) ×2
PROTECTOR NERVE ULNAR (MISCELLANEOUS) IMPLANT
SCISSORS LAP 5X35 DISP (ENDOMECHANICALS) ×2 IMPLANT
SET CHOLANGIOGRAPH MIX (MISCELLANEOUS) ×2 IMPLANT
SET TUBE SMOKE EVAC HIGH FLOW (TUBING) ×2 IMPLANT
SHEARS HARMONIC ACE PLUS 36CM (ENDOMECHANICALS) ×2 IMPLANT
SPONGE GAUZE 2X2 STER 10/PKG (GAUZE/BANDAGES/DRESSINGS) ×1
SUT MNCRL AB 4-0 PS2 18 (SUTURE) ×2 IMPLANT
SUT PDS AB 1 CT1 27 (SUTURE) ×6 IMPLANT
SYR 20ML LL LF (SYRINGE) ×2 IMPLANT
TOWEL OR 17X26 10 PK STRL BLUE (TOWEL DISPOSABLE) ×2 IMPLANT
TOWEL OR NON WOVEN STRL DISP B (DISPOSABLE) ×2 IMPLANT
TRAY LAPAROSCOPIC (CUSTOM PROCEDURE TRAY) ×2 IMPLANT
TROCAR BLADELESS OPT 5 100 (ENDOMECHANICALS) ×2 IMPLANT
TROCAR BLADELESS OPT 5 150 (ENDOMECHANICALS) ×2 IMPLANT

## 2021-02-13 NOTE — Anesthesia Preprocedure Evaluation (Signed)
Anesthesia Evaluation  Patient identified by MRN, date of birth, ID band Patient awake    Reviewed: Allergy & Precautions, NPO status , Patient's Chart, lab work & pertinent test results  History of Anesthesia Complications Negative for: history of anesthetic complications  Airway Mallampati: II  TM Distance: >3 FB Neck ROM: Full    Dental  (+) Edentulous Upper, Edentulous Lower, Dental Advisory Given   Pulmonary neg shortness of breath, neg sleep apnea, neg COPD, neg recent URI, former smoker,    breath sounds clear to auscultation       Cardiovascular hypertension, (-) angina(-) Past MI and (-) CHF  Rhythm:Regular     Neuro/Psych negative neurological ROS  negative psych ROS   GI/Hepatic Neg liver ROS, CHOLECYSTITIS   Endo/Other  negative endocrine ROS  Renal/GU Renal diseaseLab Results      Component                Value               Date                      CREATININE               0.82                02/12/2021                Musculoskeletal negative musculoskeletal ROS (+)   Abdominal   Peds  Hematology negative hematology ROS (+)   Anesthesia Other Findings   Reproductive/Obstetrics                             Anesthesia Physical Anesthesia Plan  ASA: 2  Anesthesia Plan: General   Post-op Pain Management: Toradol IV (intra-op) and Gabapentin PO (pre-op)   Induction: Intravenous  PONV Risk Score and Plan: 3 and Ondansetron and Dexamethasone  Airway Management Planned: Oral ETT  Additional Equipment: None  Intra-op Plan:   Post-operative Plan: Extubation in OR  Informed Consent: I have reviewed the patients History and Physical, chart, labs and discussed the procedure including the risks, benefits and alternatives for the proposed anesthesia with the patient or authorized representative who has indicated his/her understanding and acceptance.     Dental advisory  given  Plan Discussed with: CRNA and Anesthesiologist  Anesthesia Plan Comments:         Anesthesia Quick Evaluation

## 2021-02-13 NOTE — Transfer of Care (Signed)
Immediate Anesthesia Transfer of Care Note  Patient: Deborah Pratt  Procedure(s) Performed: LAPAROSCOPIC CHOLECYSTECTOMY SINGLE SITE WITH INTRAOPERATIVE CHOLANGIOGRAM (Abdomen)  Patient Location: PACU  Anesthesia Type:General  Level of Consciousness: awake, alert , oriented and patient cooperative  Airway & Oxygen Therapy: Patient Spontanous Breathing and Patient connected to nasal cannula oxygen  Post-op Assessment: Report given to RN and Post -op Vital signs reviewed and stable  Post vital signs: Reviewed and stable  Last Vitals:  Vitals Value Taken Time  BP 125/76 02/13/21 1127  Temp    Pulse 75 02/13/21 1128  Resp 18 02/13/21 1128  SpO2 92 % 02/13/21 1128  Vitals shown include unvalidated device data.  Last Pain:  Vitals:   02/13/21 0941  TempSrc:   PainSc: 0-No pain         Complications: No notable events documented.

## 2021-02-13 NOTE — Anesthesia Procedure Notes (Signed)
Procedure Name: Intubation Date/Time: 02/13/2021 10:10 AM Performed by: Vanessa Norwich, CRNA Pre-anesthesia Checklist: Patient identified, Emergency Drugs available, Suction available and Patient being monitored Patient Re-evaluated:Patient Re-evaluated prior to induction Oxygen Delivery Method: Circle system utilized Preoxygenation: Pre-oxygenation with 100% oxygen Induction Type: IV induction Ventilation: Mask ventilation without difficulty Laryngoscope Size: 2 and Miller Grade View: Grade I Tube type: Oral Tube size: 7.0 mm Number of attempts: 1 Airway Equipment and Method: Stylet Placement Confirmation: ETT inserted through vocal cords under direct vision, positive ETCO2 and breath sounds checked- equal and bilateral Secured at: 21 cm Tube secured with: Tape Dental Injury: Teeth and Oropharynx as per pre-operative assessment

## 2021-02-13 NOTE — Progress Notes (Signed)
Transition of Care [facility]) Screening Note  Patient Details  Name: Deborah Pratt Date of Birth: 1956/02/05  Transition of Care [facility]) CM/SW Contact:    Ewing Schlein, LCSW Phone Number: 02/13/2021, 11:08 AM  Transition of Care Department [facility]) has reviewed patient and no TOC needs have been identified at this time. We will continue to monitor patient advancement through interdisciplinary progression rounds. If new patient transition needs arise, please place a TOC consult.

## 2021-02-13 NOTE — H&P (Signed)
Deborah Pratt [facility]  [birth year] [id number]  CARE TEAM:  PCP: Pcp, No  Outpatient Care Team: Patient Care Team: Pcp, No as PCP - General  Inpatient Treatment Team: Treatment Team: Attending Provider: Edison Pace Md, MD; Charge Nurse: Arminda Resides, RN; Pharmacist: Lynelle Doctor, RPH; Registered Nurse: Eliot Ford, RN   This patient is a 65 y.o.female who presents today for surgical evaluation at the request of Larene Pickett, PA-C.   Chief complaint / Reason for evaluation: Abdominal pain with vomiting.  Probable cholecystitis.  Woman with history of asymptomatic gallstones who developed abdominal pain and nausea and vomiting yesterday morning.  Progressive.  Try to eat some eggs but it did not go well.  Tried Pepto-Bismol and switching to a very bland diet.  Worsening vomiting.  Based on concerns came to the emergency room at [facility].  Hydration and work-up.  Physical exam and ultrasound suspicious for cholecystitis.  Request for transfer to hospital with surgery availability made patient transferred to [facility] long for consultation and evaluation.  Patient has some mild history of heartburn and reflux.  She a laparoscopically assisted vaginal hysterectomy over a decade ago for fibroids.  No other abdominal surgery.  She usually moves her bowels about twice a day.  She recalls a distant history of clots in her 6s and is no longer anticoagulated.  Sounds like it was related to oral contraceptive pills and a pulmonary embolism.  Does have some chronic heartburn that usually is managed with famotidine.  Recalls a distant history of having upper and lower endoscopy that were underwhelming.  No history of Crohn's or colitis.  No history of hepatitis or pancreatitis.   Assessment  Deborah Pratt  65 y.o. female       Problem List:  Principal Problem:   Cholelithiasis with cholecystitis Active Problems:   Cholecystitis with cholelithiasis   History  physical and ultrasound suspicious for acute on chronic cholecystitis with gallstones.  Plan:  IV fluids.  IV nausea and pain control.  IV antibiotics.  Laparoscopic cholecystectomy with cholangiogram this admission.  The anatomy & physiology of hepatobiliary & pancreatic function was discussed.  The pathophysiology of gallbladder dysfunction was discussed.  Natural history risks without surgery was discussed.   I feel the risks of no intervention will lead to serious problems that outweigh the operative risks; therefore, I recommended cholecystectomy to remove the pathology.  I explained laparoscopic techniques with possible need for an open approach.  Probable cholangiogram to evaluate the bilary tract was explained as well.    Risks such as bleeding, infection, diarrhea and other bowel changes, abscess, leak, injury to other organs, need for repair of tissues / organs, need for further treatment, stroke, heart attack, death, and other risks were discussed.  I noted a good likelihood this will help address the problem, but there is a chance it may not help.  Possibility that this will not correct all abdominal symptoms was explained.  Goals of post-operative recovery were discussed as well.  We will work to minimize complications.  An educational handout further explaining the pathology and treatment options was given as well.  Questions were answered.  The patient and her husband express understanding & wish to proceed with surgery.  Apparently she has not gotten a COVID test.  We will try and get a rapid 1 done so that we can expedite surgery treatment today.  -VTE prophylaxis- SCDs, etc  -mobilize as tolerated to help recovery  30 minutes spent in review, evaluation, examination, counseling, and coordination of care.  More than 50% of that time was spent in counseling.  Ardeth SportsmanSteven C. Jenavie Stanczak, MD, FACS, MASCRS Esophageal, Gastrointestinal & Colorectal Surgery Robotic and Minimally Invasive  Surgery  [facility], [facility]   [facility]  [address] [place], [place] [id number] [phone] Fax [phone] Main  CONTACT INFORMATION:  Weekday (9AM-5PM): Call [facility] main office at [phone]  Weeknight (5PM-9AM) or Weekend/Holiday: Check [email or web address] (password " [email or web address]") for General Surgery [facility] coverage  (Please, do not use SecureChat as it is not reliable communication to operating surgeons for immediate patient care)      02/13/2021      Past Medical History:  Diagnosis Date   Allergy    ANXIETY 11/11/2006   Qualifier: Diagnosis of  By: Jonny RuizJohn MD, Len BlalockJames W    Asthma    CHOLELITHIASIS 11/08/2006   Qualifier: Diagnosis of  By: Maris BergerSherwood, Elizabeth Ann    DEPRESSION 11/11/2006   Qualifier: Diagnosis of  By: Jonny RuizJohn MD, Len BlalockJames W    Generalized headaches    Hyperlipidemia 10/03/2014   HYPERTENSION 11/08/2006   Qualifier: Diagnosis of  By: Maris BergerSherwood, Elizabeth Ann    Kidney stones    Pulmonary embolism (HCC) 65 yo    on BCP   UTI (lower urinary tract infection)     Past Surgical History:  Procedure Laterality Date   ABDOMINAL HYSTERECTOMY     COLONOSCOPY     NASAL SINUS SURGERY     TUBAL LIGATION      Social History   Socioeconomic History   Marital status: Married    Spouse name: Animal nutritionistCliff   Number of children: 2   Years of education: 12   Highest education level: Not on file  Occupational History   Occupation: Engineer, miningBus Operator  Tobacco Use   Smoking status: Former    Types: Cigarettes    Quit date: 02/15/2014    Years since quitting: 7.0   Smokeless tobacco: Never  Substance and Sexual Activity   Alcohol use: Yes    Comment: occasionally   Drug use: No   Sexual activity: Not on file  Other Topics Concern   Not on file  Social History Narrative   Lives with her husband and his son. Her adult sons live in [place].   Social Determinants of Health   Financial Resource Strain: Not on file  Food  Insecurity: Not on file  Transportation Needs: Not on file  Physical Activity: Not on file  Stress: Not on file  Social Connections: Not on file  Intimate Partner Violence: Not on file    Family History  Problem Relation Age of Onset   Heart disease Mother    Gout Father    Arthritis Other    Arthritis Other    Heart disease Other    Stroke Other    Hypertension Other     Current Facility-Administered Medications  Medication Dose Route Frequency Provider Last Rate Last Admin   acetaminophen (TYLENOL) tablet 650 mg  650 mg Oral Q6H PRN Darnell LevelGerkin, Todd, MD       Or   acetaminophen (TYLENOL) suppository 650 mg  650 mg Rectal Q6H PRN Darnell LevelGerkin, Todd, MD       dextrose 5 % and 0.45 % NaCl with KCl 20 mEq/L infusion   Intravenous Continuous Darnell LevelGerkin, Todd, MD 100 mL/hr at 02/13/21 0159 New Bag at 02/13/21 0159   HYDROmorphone (DILAUDID) injection 1 mg  1 mg Intravenous Q2H PRN Armandina Gemma, MD       ondansetron (ZOFRAN-ODT) disintegrating tablet 4 mg  4 mg Oral Q6H PRN Armandina Gemma, MD       Or   ondansetron (ZOFRAN) injection 4 mg  4 mg Intravenous Q6H PRN Armandina Gemma, MD       oxyCODONE (Oxy IR/ROXICODONE) immediate release tablet 5-10 mg  5-10 mg Oral Q4H PRN Armandina Gemma, MD         Allergies  Allergen Reactions   Other Anaphylaxis    *Potatoes*    Peanut-Containing Drug Products Hives   Shellfish Allergy Hives   Acetaminophen Rash   Demerol [Meperidine] Hives   Fish Oil Hives   Latex Rash   Morphine Rash   Pork-Derived Products Hives   Tomato Hives    ROS:   All other systems reviewed & are negative except per HPI or as noted below: Constitutional:  No fevers, chills, sweats.  Weight stable Eyes:  No vision changes, No discharge HENT:  No sore throats, nasal drainage Lymph: No neck swelling, No bruising easily Pulmonary:  No cough, productive sputum CV: No orthopnea, PND  Patient walks 30 minutes for about 2 miles without difficulty.  No exertional chest/neck/shoulder/arm  pain. GI:  No personal nor family history of GI/colon cancer, inflammatory bowel disease, irritable bowel syndrome, allergy such as Celiac Sprue, dietary/dairy problems, colitis, ulcers nor gastritis.  No recent sick contacts/gastroenteritis.  No travel outside the country.  No changes in diet. Renal: No UTIs, No hematuria Genital:  No drainage, bleeding, masses Musculoskeletal: No severe joint pain.  Good ROM major joints Skin:  No sores or lesions.  No rashes Heme/Lymph:  No easy bleeding.  No swollen lymph nodes Neuro: No focal weakness/numbness.  No seizures Psych: No suicidal ideation.  No hallucinations  BP 124/89 (BP Location: Right Arm)    Pulse 85    Temp 98.4 F (36.9 C) (Oral)    Resp 17    Ht 5\' 8"  (1.727 m)    Wt 82.6 kg    SpO2 98%    BMI 27.67 kg/m   Physical Exam:  Constitutional: Not cachectic.  Hygeine adequate.  Vitals signs as above.   Eyes: Pupils reactive, normal extraocular movements. Sclera nonicteric Neuro: CN II-XII intact.  No major focal sensory defects.  No major motor deficits. Lymph: No head/neck/groin lymphadenopathy Psych:  No severe agitation.  No severe anxiety.  Judgment & insight Adequate, Oriented x4, HENT: Normocephalic, Mucus membranes moist.  No thrush.   Neck: Supple, No tracheal deviation.  No obvious thyromegaly Chest: No pain to chest wall compression.  Good respiratory excursion.  No audible wheezing CV:  Pulses intact.   Regular rhythm.  No major extremity edema  Abdomen:  Flat Hernia: Not present. Diastasis recti: Not present. Soft.   Nondistended.  Tenderness at right upper quadrant and epigastric regions.  No major Murphy sign.  Rest of the abdomen is nontender. .  No hepatomegaly.  No splenomegaly  Gen:  Inguinal hernia: Not present.  Inguinal lymph nodes: without lymphadenopathy.    Rectal: (Deferred)  Ext: No obvious deformity or contracture.  Edema: Not present.  No cyanosis Skin: No major subcutaneous nodules.  Warm and  dry Musculoskeletal: Severe joint rigidity not present.  No obvious clubbing.  No digital petechiae.     Results:   Labs: Results for orders placed or performed during the hospital encounter of 02/12/21 (from the past 48 hour(s))  Lipase, blood  Status: None   Collection Time: 02/12/21  6:48 PM  Result Value Ref Range   Lipase 28 11 - 51 U/L    Comment: Performed at [facility], [place] [address] [place], [place] [address]  Comprehensive metabolic panel     Status: Abnormal   Collection Time: 02/12/21  6:48 PM  Result Value Ref Range   Sodium 135 135 - 145 mmol/L   Potassium 3.3 (L) 3.5 - 5.1 mmol/L   Chloride 105 98 - 111 mmol/L   CO2 22 22 - 32 mmol/L   Glucose, Bld 105 (H) 70 - 99 mg/dL    Comment: Glucose reference range applies only to samples taken after fasting for at least 8 hours.   BUN 10 8 - 23 mg/dL   Creatinine, Ser 0.82 0.44 - 1.00 mg/dL   Calcium 9.0 8.9 - 10.3 mg/dL   Total Protein 7.7 6.5 - 8.1 g/dL   Albumin 4.0 3.5 - 5.0 g/dL   AST 26 15 - 41 U/L   ALT 28 0 - 44 U/L   Alkaline Phosphatase 146 (H) 38 - 126 U/L   Total Bilirubin 0.7 0.3 - 1.2 mg/dL   GFR, Estimated >60 >60 mL/min    Comment: (NOTE) Calculated using the CKD-EPI Creatinine Equation (2021)    Anion gap 8 5 - 15    Comment: Performed at [facility], [place] [address] [place], [place] [address]  CBC     Status: None   Collection Time: 02/12/21  6:48 PM  Result Value Ref Range   WBC 6.8 4.0 - 10.5 K/uL   RBC 4.13 3.87 - 5.11 MIL/uL   Hemoglobin 12.7 12.0 - 15.0 g/dL   HCT 39.1 36.0 - 46.0 %   MCV 94.7 80.0 - 100.0 fL   MCH 30.8 26.0 - 34.0 pg   MCHC 32.5 30.0 - 36.0 g/dL   RDW 12.8 11.5 - 15.5 %   Platelets 224 150 - 400 K/uL   nRBC 0.0 0.0 - 0.2 %    Comment: Performed at [facility], [place] [address], [place], [place] [address]  Troponin I (High Sensitivity)     Status: None   Collection Time: 02/12/21  6:48 PM  Result Value Ref  Range   Troponin I (High Sensitivity) 3 <18 ng/L    Comment: (NOTE) Elevated high sensitivity troponin I (hsTnI) values and significant  changes across serial measurements may suggest ACS but many other  chronic and acute conditions are known to elevate hsTnI results.  Refer to the "Links" section for chest pain algorithms and additional  guidance. Performed at [facility], [place] [address], [place], [place] [address]   Troponin I (High Sensitivity)     Status: None   Collection Time: 02/12/21  8:25 PM  Result Value Ref Range   Troponin I (High Sensitivity) 4 <18 ng/L    Comment: (NOTE) Elevated high sensitivity troponin I (hsTnI) values and significant  changes across serial measurements may suggest ACS but many other  chronic and acute conditions are known to elevate hsTnI results.  Refer to the "Links" section for chest pain algorithms and additional  guidance. Performed at [facility], [place] [address] [place], [place] [address]   Urinalysis, Routine w reflex microscopic Urine, Clean Catch     Status: Abnormal   Collection Time: 02/12/21 10:02 PM  Result Value Ref Range   Color, Urine YELLOW YELLOW   APPearance HAZY (A) CLEAR  Specific Gravity, Urine 1.011 1.005 - 1.030   pH 6.0 5.0 - 8.0   Glucose, UA NEGATIVE NEGATIVE mg/dL   Hgb urine dipstick SMALL (A) NEGATIVE   Bilirubin Urine NEGATIVE NEGATIVE   Ketones, ur NEGATIVE NEGATIVE mg/dL   Protein, ur NEGATIVE NEGATIVE mg/dL   Nitrite NEGATIVE NEGATIVE   Leukocytes,Ua NEGATIVE NEGATIVE   RBC / HPF 0-5 0 - 5 RBC/hpf   WBC, UA 0-5 0 - 5 WBC/hpf   Bacteria, UA NONE SEEN NONE SEEN   Squamous Epithelial / LPF 6-10 0 - 5   Mucus PRESENT     Comment: Performed at [facility], [address], [place], [place] [address]    Imaging / Studies: DG Chest 2 View  Result Date: 02/12/2021 CLINICAL DATA:  Chest pain radiating to the upper abdomen. EXAM: CHEST - 2 VIEW  COMPARISON:  06/15/2013 FINDINGS: The heart size and mediastinal contours are within normal limits. Both lungs are clear. The visualized skeletal structures are unremarkable. IMPRESSION: No active cardiopulmonary disease. Electronically Signed   By: Burman Nieves M.D.   On: 02/12/2021 19:44   US Abdomen Limited RUQ (LIVER/GB)  Result Date: 02/12/2021 CLINICAL DATA:  Right upper quadrant pain EXAM: ULTRASOUND ABDOMEN LIMITED RIGHT UPPER QUADRANT COMPARISON:  CT 06/30/2006 FINDINGS: Gallbladder: Several stones in the gallbladder, largest measuring 9 mm in diameter and located in the gallbladder neck. Gallbladder sludge is present. Murphy's sign is positive. Echogenic foci in the gallbladder wall with ring down artifact consistent with cholesterolosis. Common bile duct: Diameter: 6 mm, normal Liver: No focal lesion identified. Within normal limits in parenchymal echogenicity. Portal vein is patent on color Doppler imaging with normal direction of blood flow towards the liver. Other: None. IMPRESSION: 1. Cholelithiasis with gallbladder sludge and positive Murphy's sign. Changes suggest acute cholecystitis in the appropriate clinical setting. 2. Findings of gallbladder cholesterolosis. Electronically Signed   By: Burman Nieves M.D.   On: 02/12/2021 19:43    Medications / Allergies: per chart  Antibiotics: Anti-infectives (From admission, onward)    Start     Dose/Rate Route Frequency Ordered Stop   02/12/21 2300  cefTRIAXone (ROCEPHIN) 2 g in sodium chloride 0.9 % 100 mL IVPB        2 g 200 mL/hr over 30 Minutes Intravenous  Once 02/12/21 2257 02/13/21 0135         Note: Portions of this report may have been transcribed using voice recognition software. Every effort was made to ensure accuracy; however, inadvertent computerized transcription errors may be present.   Any transcriptional errors that result from this process are unintentional.    Ardeth Sportsman, MD, FACS,  MASCRS Esophageal, Gastrointestinal & Colorectal Surgery Robotic and Minimally Invasive Surgery  [facility], [facility]   [facility]  [address] [place], [place] [address] [phone] Fax [phone] Main  CONTACT INFORMATION:  Weekday (9AM-5PM): Call [facility] main office at [phone]  Weeknight (5PM-9AM) or Weekend/Holiday: Check [email or web address] (password " [email or web address]") for General Surgery [facility] coverage  (Please, do not use SecureChat as it is not reliable communication to operating surgeons for immediate patient care)        02/13/2021  7:53 AM

## 2021-02-13 NOTE — Anesthesia Postprocedure Evaluation (Signed)
Anesthesia Post Note  Patient: Aliya Sol Whitney-Taylor  Procedure(s) Performed: LAPAROSCOPIC CHOLECYSTECTOMY SINGLE SITE WITH INTRAOPERATIVE CHOLANGIOGRAM (Abdomen)     Patient location during evaluation: PACU Anesthesia Type: General Level of consciousness: awake and alert Pain management: pain level controlled Vital Signs Assessment: post-procedure vital signs reviewed and stable Respiratory status: spontaneous breathing, nonlabored ventilation, respiratory function stable and patient connected to nasal cannula oxygen Cardiovascular status: blood pressure returned to baseline and stable Postop Assessment: no apparent nausea or vomiting Anesthetic complications: no   No notable events documented.  Last Vitals:  Vitals:   02/13/21 1514 02/13/21 1636  BP: (!) 149/89 130/75  Pulse: (!) 104 90  Resp: 18 16  Temp: 36.7 C 36.7 C  SpO2: 94% 97%    Last Pain:  Vitals:   02/13/21 1215  TempSrc: Oral  PainSc:                  Kriti Katayama

## 2021-02-13 NOTE — Discharge Instructions (Signed)
[facility] LAPAROSCOPIC SURGERY: POST OP INSTRUCTIONS Always review your discharge instruction sheet given to you by the facility where your surgery was performed. IF YOU HAVE DISABILITY OR FAMILY LEAVE FORMS, YOU MUST BRING THEM TO THE OFFICE FOR PROCESSING.   DO NOT GIVE THEM TO YOUR DOCTOR.  PAIN CONTROL  First take acetaminophen (Tylenol) AND/or ibuprofen (Advil) to control your pain after surgery.  Follow directions on package.  Taking acetaminophen (Tylenol) and/or ibuprofen (Advil) regularly after surgery will help to control your pain and lower the amount of prescription pain medication you may need.  You should not take more than 3,000 mg (3 grams) of acetaminophen (Tylenol) in 24 hours.  You should not take ibuprofen (Advil), aleve, motrin, naprosyn or other NSAIDS if you have a history of stomach ulcers or chronic kidney disease.  A prescription for pain medication may be given to you upon discharge.  Take your pain medication as prescribed, if you still have uncontrolled pain after taking acetaminophen (Tylenol) or ibuprofen (Advil). Use ice packs to help control pain. If you need a refill on your pain medication, please contact your pharmacy.  They will contact our office to request authorization. Prescriptions will not be filled after 5pm or on week-ends.  HOME MEDICATIONS Take your usually prescribed medications unless otherwise directed.  DIET You should follow a light diet the first few days after arrival home.  Be sure to include lots of fluids daily. Avoid fatty, fried foods.   CONSTIPATION It is common to experience some constipation after surgery and if you are taking pain medication.  Increasing fluid intake and taking a stool softener (such as Colace) will usually help or prevent this problem from occurring.  A mild laxative (Milk of Magnesia or Miralax) should be taken according to package instructions if there are no bowel movements after 48  hours.  WOUND/INCISION CARE Most patients will experience some swelling and bruising in the area of the incisions.  Ice packs will help.  Swelling and bruising can take several days to resolve.  Unless discharge instructions indicate otherwise, follow guidelines below  STERI-STRIPS - you may remove your outer bandages 48 hours after surgery, and you may shower at that time.  You have steri-strips (small skin tapes) in place directly over the incision.  These strips should be left on the skin for 7-10 days.   DERMABOND/SKIN GLUE - you may shower in 24 hours.  The glue will flake off over the next 2-3 weeks. Any sutures or staples will be removed at the office during your follow-up visit.  ACTIVITIES You may resume regular (light) daily activities beginning the next day--such as daily self-care, walking, climbing stairs--gradually increasing activities as tolerated.  You may have sexual intercourse when it is comfortable.  Refrain from any heavy lifting or straining until approved by your doctor. You may drive when you are no longer taking prescription pain medication, you can comfortably wear a seatbelt, and you can safely maneuver your car and apply brakes.  FOLLOW-UP You should see your doctor in the office for a follow-up appointment approximately 2-3 weeks after your surgery.  You should have been given your post-op/follow-up appointment when your surgery was scheduled.  If you did not receive a post-op/follow-up appointment, make sure that you call for this appointment within a day or two after you arrive home to insure a convenient appointment time.   WHEN TO CALL YOUR DOCTOR: Fever over 101.0 Inability to urinate Continued bleeding from incision.   Increased pain, redness, or drainage from the incision. Increasing abdominal pain  The clinic staff is available to answer your questions during regular business hours.  Please don't hesitate to call and ask to speak to one of the nurses for  clinical concerns.  If you have a medical emergency, go to the nearest emergency room or call 911.  A surgeon from [facility] is always on call at the hospital. [address], [address], [place]  [address] ? [address] [address], [place], [place]   [address] [phone] ? [phone] ? FAX [phone] Web site: [email or web address]      Managing Your Pain After Surgery Without Opioids    Thank you for participating in our program to help patients manage their pain after surgery without opioids. This is part of our effort to provide you with the best care possible, without exposing you or your family to the risk that opioids pose.  What pain can I expect after surgery? You can expect to have some pain after surgery. This is normal. The pain is typically worse the day after surgery, and quickly begins to get better. Many studies have found that many patients are able to manage their pain after surgery with Over-the-Counter (OTC) medications such as Tylenol and Motrin. If you have a condition that does not allow you to take Tylenol or Motrin, notify your surgical team.  How will I manage my pain? The best strategy for controlling your pain after surgery is around the clock pain control with Tylenol (acetaminophen) and Motrin (ibuprofen or Advil). Alternating these medications with each other allows you to maximize your pain control. In addition to Tylenol and Motrin, you can use heating pads or ice packs on your incisions to help reduce your pain.  How will I alternate your regular strength over-the-counter pain medication? You will take a dose of pain medication every three hours. Start by taking 650 mg of Tylenol (2 pills of 325 mg) 3 hours later take 600 mg of Motrin (3 pills of 200 mg) 3 hours after taking the Motrin take 650 mg of Tylenol 3 hours after that take 600 mg of Motrin.   - 1 -  See example - if your first dose of Tylenol is at 12:00  PM   12:00 PM Tylenol 650 mg (2 pills of 325 mg)  3:00 PM Motrin 600 mg (3 pills of 200 mg)  6:00 PM Tylenol 650 mg (2 pills of 325 mg)  9:00 PM Motrin 600 mg (3 pills of 200 mg)  Continue alternating every 3 hours   We recommend that you follow this schedule around-the-clock for at least 3 days after surgery, or until you feel that it is no longer needed. Use the table on the last page of this handout to keep track of the medications you are taking. Important: Do not take more than 3000mg of Tylenol or 3200mg of Motrin in a 24-hour period. Do not take ibuprofen/Motrin if you have a history of bleeding stomach ulcers, severe kidney disease, &/or actively taking a blood thinner  What if I still have pain? If you have pain that is not controlled with the over-the-counter pain medications (Tylenol and Motrin or Advil) you might have what we call "breakthrough" pain. You will receive a prescription for a small amount of an opioid pain medication such as Oxycodone, Tramadol, or Tylenol with Codeine. Use these opioid pills in the first 24 hours after surgery if you have breakthrough pain. Do   not take more than 1 pill every 4-6 hours.  If you still have uncontrolled pain after using all opioid pills, don't hesitate to call our staff using the number provided. We will help make sure you are managing your pain in the best way possible, and if necessary, we can provide a prescription for additional pain medication.   Day 1    Time  Name of Medication Number of pills taken  Amount of Acetaminophen  Pain Level   Comments  AM PM       AM PM       AM PM       AM PM       AM PM       AM PM       AM PM       AM PM       Total Daily amount of Acetaminophen Do not take more than  3,000 mg per day      Day 2    Time  Name of Medication Number of pills taken  Amount of Acetaminophen  Pain Level   Comments  AM PM       AM PM       AM PM       AM PM       AM PM       AM PM       AM  PM       AM PM       Total Daily amount of Acetaminophen Do not take more than  3,000 mg per day      Day 3    Time  Name of Medication Number of pills taken  Amount of Acetaminophen  Pain Level   Comments  AM PM       AM PM       AM PM       AM PM          AM PM       AM PM       AM PM       AM PM       Total Daily amount of Acetaminophen Do not take more than  3,000 mg per day      Day 4    Time  Name of Medication Number of pills taken  Amount of Acetaminophen  Pain Level   Comments  AM PM       AM PM       AM PM       AM PM       AM PM       AM PM       AM PM       AM PM       Total Daily amount of Acetaminophen Do not take more than  3,000 mg per day      Day 5    Time  Name of Medication Number of pills taken  Amount of Acetaminophen  Pain Level   Comments  AM PM       AM PM       AM PM       AM PM       AM PM       AM PM       AM PM       AM PM       Total Daily amount of Acetaminophen Do not take more than    3,000 mg per day       Day 6    Time  Name of Medication Number of pills taken  Amount of Acetaminophen  Pain Level  Comments  AM PM       AM PM       AM PM       AM PM       AM PM       AM PM       AM PM       AM PM       Total Daily amount of Acetaminophen Do not take more than  3,000 mg per day      Day 7    Time  Name of Medication Number of pills taken  Amount of Acetaminophen  Pain Level   Comments  AM PM       AM PM       AM PM       AM PM       AM PM       AM PM       AM PM       AM PM       Total Daily amount of Acetaminophen Do not take more than  3,000 mg per day        For additional information about how and where to safely dispose of unused opioid medications - https://www.morepowerfulnc.org  Disclaimer: This document contains information and/or instructional materials adapted from [facility] for the typical patient with your condition. It does not replace medical advice  from your health care provider because your experience may differ from that of the typical patient. Talk to your health care provider if you have any questions about this document, your condition or your treatment plan. Adapted from [facility]  

## 2021-02-13 NOTE — Interval H&P Note (Signed)
History and Physical Interval Note:  02/13/2021 9:08 AM  Deborah Pratt  has presented today for surgery, with the diagnosis of CHOLECYSTITIS.  The various methods of treatment have been discussed with the patient and family. After consideration of risks, benefits and other options for treatment, the patient has consented to  Procedure(s): LAPAROSCOPIC CHOLECYSTECTOMY SINGLE SITE WITH INTRAOPERATIVE CHOLANGIOGRAM (N/A) as a surgical intervention.  The patient's history has been reviewed, patient examined, no change in status, stable for surgery.  I have reviewed the patient's chart and labs.  Questions were answered to the patient's satisfaction.    I have re-reviewed the the patient's records, history, medications, and allergies.  I have re-examined the patient.  I again discussed intraoperative plans and goals of post-operative recovery.  The patient agrees to proceed.  Deborah Pratt  [birth year] [id number]  Patient Care Team: Carmelina Dane, MD as PCP - General (Family Medicine) Rachael Fee, MD as Attending Physician (Gastroenterology)  Patient Active Problem List   Diagnosis Date Noted   Cholecystitis with cholelithiasis 02/13/2021   Hyperlipidemia 10/03/2014   Daytime somnolence 07/26/2014   Snoring 03/03/2012   Kidney stones    Allergic rhinitis, cause unspecified    Asthma    Pulmonary embolism (HCC)    Preventative health care 08/27/2011   HYPERSOMNIA 04/16/2008   ANXIETY 11/11/2006   Depression 11/11/2006   Essential hypertension 11/08/2006   Cholelithiasis with cholecystitis 11/08/2006   Headache(784.0) 11/08/2006    Past Medical History:  Diagnosis Date   Allergy    ANXIETY 11/11/2006   Qualifier: Diagnosis of  By: Jonny Ruiz MD, Len Blalock    Asthma    CHOLELITHIASIS 11/08/2006   Qualifier: Diagnosis of  By: Maris Berger    DEPRESSION 11/11/2006   Qualifier: Diagnosis of  By: Jonny Ruiz MD, Len Blalock    Generalized headaches     Hyperlipidemia 10/03/2014   HYPERTENSION 11/08/2006   Qualifier: Diagnosis of  By: Maris Berger    Kidney stones    Pulmonary embolism (HCC) 65 yo    on BCP   UTI (lower urinary tract infection)     Past Surgical History:  Procedure Laterality Date   COLONOSCOPY     LAPAROSCOPIC ASSISTED VAGINAL HYSTERECTOMY     NASAL SINUS SURGERY     TUBAL LIGATION      Social History   Socioeconomic History   Marital status: Married    Spouse name: Animal nutritionist   Number of children: 2   Years of education: 12   Highest education level: Not on file  Occupational History   Occupation: Engineer, mining  Tobacco Use   Smoking status: Former    Types: Cigarettes    Quit date: 02/15/2014    Years since quitting: 7.0   Smokeless tobacco: Never  Substance and Sexual Activity   Alcohol use: Yes    Comment: occasionally   Drug use: No   Sexual activity: Not on file  Other Topics Concern   Not on file  Social History Narrative   Lives with her husband and his son. Her adult sons live in [place].   Social Determinants of Health   Financial Resource Strain: Not on file  Food Insecurity: Not on file  Transportation Needs: Not on file  Physical Activity: Not on file  Stress: Not on file  Social Connections: Not on file  Intimate Partner Violence: Not on file    Family History  Problem Relation Age of Onset   Heart disease Mother  Gout Father    Arthritis Other    Arthritis Other    Heart disease Other    Stroke Other    Hypertension Other     Medications Prior to Admission  Medication Sig Dispense Refill Last Dose   diphenhydrAMINE (BENADRYL) 25 MG tablet Take 1 tablet (25 mg total) by mouth every 6 (six) hours. (Patient not taking: Reported on 02/12/2021) 20 tablet 0 Completed Course   famotidine (PEPCID) 20 MG tablet Take 1 tablet (20 mg total) by mouth 2 (two) times daily. (Patient not taking: Reported on 02/12/2021) 14 tablet 0 Completed Course   oxyCODONE (ROXICODONE) 5  MG immediate release tablet Take 1 tablet (5 mg total) by mouth every 6 (six) hours as needed for severe pain. (Patient not taking: Reported on 09/14/2015) 6 tablet 0 Completed Course   predniSONE (DELTASONE) 50 MG tablet Take 1 tablet (50 mg total) by mouth daily with breakfast. (Patient not taking: Reported on 02/12/2021) 2 tablet 0 Completed Course    Current Facility-Administered Medications  Medication Dose Route Frequency Provider Last Rate Last Admin   acetaminophen (TYLENOL) tablet 650 mg  650 mg Oral Q6H PRN Darnell Level, MD       Or   acetaminophen (TYLENOL) suppository 650 mg  650 mg Rectal Q6H PRN Darnell Level, MD       bupivacaine liposome (EXPAREL) 1.3 % injection 266 mg  20 mL Infiltration Once Karie Soda, MD       Chlorhexidine Gluconate Cloth 2 % PADS 6 each  6 each Topical Once Karie Soda, MD       dextrose 5 % and 0.45 % NaCl with KCl 20 mEq/L infusion   Intravenous Continuous Darnell Level, MD 100 mL/hr at 02/13/21 0159 New Bag at 02/13/21 0159   [START ON 02/14/2021] feeding supplement (ENSURE PRE-SURGERY) liquid 296 mL  296 mL Oral Once Karie Soda, MD       HYDROmorphone (DILAUDID) injection 1 mg  1 mg Intravenous Q2H PRN Darnell Level, MD       ondansetron (ZOFRAN-ODT) disintegrating tablet 4 mg  4 mg Oral Q6H PRN Darnell Level, MD       Or   ondansetron (ZOFRAN) injection 4 mg  4 mg Intravenous Q6H PRN Darnell Level, MD       oxyCODONE (Oxy IR/ROXICODONE) immediate release tablet 5-10 mg  5-10 mg Oral Q4H PRN Darnell Level, MD         Allergies  Allergen Reactions   Other Anaphylaxis    *Potatoes*    Peanut-Containing Drug Products Hives   Shellfish Allergy Hives   Acetaminophen Rash   Demerol [Meperidine] Hives   Fish Oil Hives   Latex Rash   Morphine Rash   Pork-Derived Products Hives   Tomato Hives    BP 124/89 (BP Location: Right Arm)    Pulse 85    Temp 98.4 F (36.9 C) (Oral)    Resp 17    Ht  (1.727 m)    Wt 82.6 kg    SpO2 98%    BMI 27.67  kg/m   Labs: Results for orders placed or performed during the hospital encounter of 02/12/21 (from the past 48 hour(s))  Lipase, blood     Status: None   Collection Time: 02/12/21  6:48 PM  Result Value Ref Range   Lipase 28 11 - 51 U/L    Comment: Performed at [facility], [address] [place], [place] [address]  Comprehensive metabolic panel  Status: Abnormal   Collection Time: 02/12/21  6:48 PM  Result Value Ref Range   Sodium 135 135 - 145 mmol/L   Potassium 3.3 (L) 3.5 - 5.1 mmol/L   Chloride 105 98 - 111 mmol/L   CO2 22 22 - 32 mmol/L   Glucose, Bld 105 (H) 70 - 99 mg/dL    Comment: Glucose reference range applies only to samples taken after fasting for at least 8 hours.   BUN 10 8 - 23 mg/dL   Creatinine, Ser 5.10 0.44 - 1.00 mg/dL   Calcium 9.0 8.9 - 25.8 mg/dL   Total Protein 7.7 6.5 - 8.1 g/dL   Albumin 4.0 3.5 - 5.0 g/dL   AST 26 15 - 41 U/L   ALT 28 0 - 44 U/L   Alkaline Phosphatase 146 (H) 38 - 126 U/L   Total Bilirubin 0.7 0.3 - 1.2 mg/dL   GFR, Estimated >52 >77 mL/min    Comment: (NOTE) Calculated using the CKD-EPI Creatinine Equation (2021)    Anion gap 8 5 - 15    Comment: Performed at [facility], [address], [place], [place] [address]  CBC     Status: None   Collection Time: 02/12/21  6:48 PM  Result Value Ref Range   WBC 6.8 4.0 - 10.5 K/uL   RBC 4.13 3.87 - 5.11 MIL/uL   Hemoglobin 12.7 12.0 - 15.0 g/dL   HCT 53.6 14.4 - 31.5 %   MCV 94.7 80.0 - 100.0 fL   MCH 30.8 26.0 - 34.0 pg   MCHC 32.5 30.0 - 36.0 g/dL   RDW 40.0 86.7 - 61.9 %   Platelets 224 150 - 400 K/uL   nRBC 0.0 0.0 - 0.2 %    Comment: Performed at [facility], [address] [place], [place] [address]  Troponin I (High Sensitivity)     Status: None   Collection Time: 02/12/21  6:48 PM  Result Value Ref Range   Troponin I (High Sensitivity) 3 <18 ng/L    Comment: (NOTE) Elevated high sensitivity troponin I  (hsTnI) values and significant  changes across serial measurements may suggest ACS but many other  chronic and acute conditions are known to elevate hsTnI results.  Refer to the "Links" section for chest pain algorithms and additional  guidance. Performed at [facility], [address] [place], [place] [address]   Troponin I (High Sensitivity)     Status: None   Collection Time: 02/12/21  8:25 PM  Result Value Ref Range   Troponin I (High Sensitivity) 4 <18 ng/L    Comment: (NOTE) Elevated high sensitivity troponin I (hsTnI) values and significant  changes across serial measurements may suggest ACS but many other  chronic and acute conditions are known to elevate hsTnI results.  Refer to the "Links" section for chest pain algorithms and additional  guidance. Performed at [facility], [address], [place], [place] [address]   Urinalysis, Routine w reflex microscopic Urine, Clean Catch     Status: Abnormal   Collection Time: 02/12/21 10:02 PM  Result Value Ref Range   Color, Urine YELLOW YELLOW   APPearance HAZY (A) CLEAR   Specific Gravity, Urine 1.011 1.005 - 1.030   pH 6.0 5.0 - 8.0   Glucose, UA NEGATIVE NEGATIVE mg/dL   Hgb urine dipstick SMALL (A) NEGATIVE   Bilirubin Urine NEGATIVE NEGATIVE   Ketones, ur NEGATIVE NEGATIVE mg/dL   Protein, ur NEGATIVE  NEGATIVE mg/dL   Nitrite NEGATIVE NEGATIVE   Leukocytes,Ua NEGATIVE NEGATIVE   RBC / HPF 0-5 0 - 5 RBC/hpf   WBC, UA 0-5 0 - 5 WBC/hpf   Bacteria, UA NONE SEEN NONE SEEN   Squamous Epithelial / LPF 6-10 0 - 5   Mucus PRESENT     Comment: Performed at [facility], [address], [place], [place] [address]  Resp Panel by RT-PCR (Flu A&B, Covid) Nasopharyngeal Swab     Status: None   Collection Time: 02/13/21  8:20 AM   Specimen: Nasopharyngeal Swab; Nasopharyngeal(NP) swabs in vial transport medium  Result Value Ref Range   SARS Coronavirus 2 by RT PCR  NEGATIVE NEGATIVE    Comment: (NOTE) SARS-CoV-2 target nucleic acids are NOT DETECTED.  The SARS-CoV-2 RNA is generally detectable in upper respiratory specimens during the acute phase of infection. The lowest concentration of SARS-CoV-2 viral copies this assay can detect is 138 copies/mL. A negative result does not preclude SARS-Cov-2 infection and should not be used as the sole basis for treatment or other patient management decisions. A negative result may occur with  improper specimen collection/handling, submission of specimen other than nasopharyngeal swab, presence of viral mutation(s) within the areas targeted by this assay, and inadequate number of viral copies(<138 copies/mL). A negative result must be combined with clinical observations, patient history, and epidemiological information. The expected result is Negative.  Fact Sheet for Patients:  BloggerCourse.com  Fact Sheet for Healthcare Providers:  SeriousBroker.it  This test is no t yet approved or cleared by the [place] FDA and  has been authorized for detection and/or diagnosis of SARS-CoV-2 by FDA under an Emergency Use Authorization (EUA). This EUA will remain  in effect (meaning this test can be used) for the duration of the COVID-19 declaration under Section 564(b)(1) of the Act, 21 U.S.C.section 360bbb-3(b)(1), unless the authorization is terminated  or revoked sooner.       Influenza A by PCR NEGATIVE NEGATIVE   Influenza B by PCR NEGATIVE NEGATIVE    Comment: (NOTE) The Xpert Xpress SARS-CoV-2/FLU/RSV plus assay is intended as an aid in the diagnosis of influenza from Nasopharyngeal swab specimens and should not be used as a sole basis for treatment. Nasal washings and aspirates are unacceptable for Xpert Xpress SARS-CoV-2/FLU/RSV testing.  Fact Sheet for Patients: BloggerCourse.com  Fact Sheet for Healthcare  Providers: SeriousBroker.it  This test is not yet approved or cleared by the [place] FDA and has been authorized for detection and/or diagnosis of SARS-CoV-2 by FDA under an Emergency Use Authorization (EUA). This EUA will remain in effect (meaning this test can be used) for the duration of the COVID-19 declaration under Section 564(b)(1) of the Act, 21 U.S.C. section 360bbb-3(b)(1), unless the authorization is terminated or revoked.  Performed at [facility], [address], [place], [place] [address]     Imaging / Studies: DG Chest 2 View  Result Date: 02/12/2021 CLINICAL DATA:  Chest pain radiating to the upper abdomen. EXAM: CHEST - 2 VIEW COMPARISON:  06/15/2013 FINDINGS: The heart size and mediastinal contours are within normal limits. Both lungs are clear. The visualized skeletal structures are unremarkable. IMPRESSION: No active cardiopulmonary disease. Electronically Signed   By: Burman Nieves M.D.   On: 02/12/2021 19:44   US Abdomen Limited RUQ (LIVER/GB)  Result Date: 02/12/2021 CLINICAL DATA:  Right upper quadrant pain EXAM: ULTRASOUND ABDOMEN LIMITED RIGHT UPPER QUADRANT COMPARISON:  CT 06/30/2006 FINDINGS: Gallbladder: Several stones in  the gallbladder, largest measuring 9 mm in diameter and located in the gallbladder neck. Gallbladder sludge is present. Murphy's sign is positive. Echogenic foci in the gallbladder wall with ring down artifact consistent with cholesterolosis. Common bile duct: Diameter: 6 mm, normal Liver: No focal lesion identified. Within normal limits in parenchymal echogenicity. Portal vein is patent on color Doppler imaging with normal direction of blood flow towards the liver. Other: None. IMPRESSION: 1. Cholelithiasis with gallbladder sludge and positive Murphy's sign. Changes suggest acute cholecystitis in the appropriate clinical setting. 2. Findings of gallbladder cholesterolosis. Electronically  Signed   By: Burman Nieves M.D.   On: 02/12/2021 19:43     .Ardeth Sportsman, M.D., F.A.C.S. Gastrointestinal and Minimally Invasive Surgery [facility] [address], [address] [place], [place] [address] [phone] Main / Paging  02/13/2021 9:08 AM    Ardeth Sportsman

## 2021-02-13 NOTE — Op Note (Signed)
02/13/2021  PATIENT:  Deborah Pratt  65 y.o. female  Patient Care Team: Carmelina Dane, MD as PCP - General (Family Medicine) Rachael Fee, MD as Attending Physician (Gastroenterology)  PRE-OPERATIVE DIAGNOSIS:    Acute on Chronic Calculus Cholecystitis  POST-OPERATIVE DIAGNOSIS:   Acute on Chronic Calculus Cholecystitis  PROCEDURE:  SINGLE SITE Laparoscopic cholecystectomy with intraoperative cholangiogram (CPT code 74259)  SURGEON:  Ardeth Sportsman, MD, FACS.  ASSISTANT: OR Staff   ANESTHESIA:    General with endotracheal intubation Local anesthetic as a field block  EBL:  (See Anesthesia Intraoperative Record) Total I/O In: 1120 [I.V.:1000; IV Piggyback:120] Out: 10 [Blood:10]  Delay start of Pharmacological VTE agent (>24hrs) due to surgical blood loss or risk of bleeding:  no  DRAINS: None   SPECIMEN: Gallbladder    DISPOSITION OF SPECIMEN:  PATHOLOGY  COUNTS:  YES  PLAN OF CARE: Admit to inpatient   PATIENT DISPOSITION:  PACU - hemodynamically stable.  INDICATION: Pleasant woman with known gallstones that usually been asymptomatic but then had an attack classic for biliary colic.  Persistent pain with nausea and vomiting.  Murphy sign and ultrasound showing inflammation concerning for cholecystitis.  We recommended admission, IV antibiotics, and cholecystectomy  The anatomy & physiology of hepatobiliary & pancreatic function was discussed.  The pathophysiology of gallbladder dysfunction was discussed.  Natural history risks without surgery was discussed.   I feel the risks of no intervention will lead to serious problems that outweigh the operative risks; therefore, I recommended cholecystectomy to remove the pathology.  I explained laparoscopic techniques with possible need for an open approach.  Probable cholangiogram to evaluate the bilary tract was explained as well.    Risks such as bleeding, infection, abscess, leak, injury to other  organs, need for further treatment, heart attack, death, and other risks were discussed.  I noted a good likelihood this will help address the problem.  Possibility that this will not correct all abdominal symptoms was explained.  Goals of post-operative recovery were discussed as well.  We will work to minimize complications.  An educational handout further explaining the pathology and treatment options was given as well.  Questions were answered.  The patient expresses understanding & wishes to proceed with surgery.  OR FINDINGS: Very enlarged gallbladder with significant thickening.  Chronic adhesions with acute edema consistent with acute on chronic cholecystitis.  Short and narrowed cystic duct.  Cholangiogram showing classic biliary anatomy without any evidence of leak, obstruction, choledocholithiasis.  Liver: normal  DESCRIPTION:   The patient was identified & brought in the operating room. The patient was positioned supine with arms tucked. SCDs were active during the entire case. The patient underwent general anesthesia without any difficulty.  The abdomen was prepped and draped in a sterile fashion. A Surgical Timeout confirmed our plan.  I made a transverse curvilinear incision through the superior umbilical fold.  I placed a 65mm long port through the supraumbilical fascia using a modified Hassan cutdown technique with umbilical stalk fascial countertraction. I began carbon dioxide insufflation.  No change in end tidal CO2 measurement.   Camera inspection revealed no injury. There were no adhesions to the anterior abdominal wall supraumbilically.  I proceeded to continue with single site technique. I placed a #5 port in left upper aspect of the wound. I placed a 5 mm atraumatic grasper in the right inferior aspect of the wound.  I turned attention to the right upper quadrant.  Gallbladder was very distended and reached  out nearly to the umbilicus as it was quite dilated.  However I was able  to grab the dome of the gallbladder the gallbladder fundus was elevated cephalad. I freed adhesions to the ventral surface of the gallbladder off carefully of primarily greater omentum as well as some colon and duodenal bulb using focused hydrodissection and ultrasound harmonic dissection.  I freed the peritoneal coverings between the gallbladder and the liver on the posteriolateral and anteriomedial walls. I alternated between Harmonic & blunt [place] dissection to help get a good critical view of the cystic artery and cystic duct.  did further dissection to free 80%of the gallbladder off the liver bed to get a good critical view of the infundibulum and cystic duct. I dissected out the cystic artery; and, after getting a good 360 view, ligated the anterior & posterior branches of the cystic artery close on the infundibulum using the Harmonic ultrasonic dissection.  I skeletonized the cystic duct.  I placed a clip on the infundibulum. I did a partial cystic duct-otomy and ensured patency. I placed a 5 [place] cholangiocatheter through a puncture site at the right subcostal ridge of the abdominal wall and directed it into the cystic duct.  We ran a cholangiogram with dilute radio-opaque contrast and continuous fluoroscopy. Contrast flowed from a side branch consistent with cystic duct cannulization. Contrast flowed up the common hepatic duct into the right and left intrahepatic chains out to secondary radicals. Contrast flowed down the common bile duct easily across the normal ampulla into the duodenum.  This was consistent with a normal cholangiogram.  I removed the cholangiocatheter. I placed clips on the cystic duct x4.  I completed cystic duct transection. I freed the gallbladder from its remaining attachments to the liver.  The liver did not have any strong evidence of cirrhosis or concerning masses or lesions so I held off on any liver biopsy.  I ensured hemostasis on the gallbladder fossa of the liver and  elsewhere. I inspected the rest of the abdomen & detected no injury nor bleeding elsewhere.  Placed the gallbladder inside and EcoSac and removed the gallbladder out the supraumbilical fascia.  I had to open up the fascia 2.5 cm since it was very distended and edematous with significant gallbladder wall thickening.    I closed the fascia transversely using  #1 PDS interrupted stitches. I closed the skin using 4-0 monocryl stitch.  Sterile dressing was applied. The patient was extubated & arrived in the PACU in stable condition..  I had discussed postoperative care with the patient in the holding area.  Called and left a voicemail on the husband's phone.  Recommendations were made.  We will reach out to him again later  Ardeth Sportsman, M.D., F.A.C.S. Gastrointestinal and Minimally Invasive Surgery [facility] [address], [address] [place], [place] [address] [phone] Main / Paging  02/13/2021 11:28 AM

## 2021-02-13 NOTE — ED Notes (Signed)
ED TO INPATIENT HANDOFF REPORT  ED Nurse Name and Phone #: [phone] Charise Carwin, RN   S Name/Age/Gender Deborah Pratt Whitney-Taylor 65 y.o. female Room/Bed: WA14/WA14  Code Status   Code Status: Full Code  Home/SNF/Other Home Patient oriented to: self, place, time, and situation Is this baseline? Yes   Triage Complete: Triage complete  Chief Complaint Cholecystitis with cholelithiasis [K80.10]  Triage Note Pt reports upper CP radiating down to upper abdomen since 0100 this morning. Pt seen at urgent care and sent here. No relief with pepto bismol   Allergies Allergies  Allergen Reactions   Other Anaphylaxis    *Potatoes*    Peanut-Containing Drug Products Hives   Shellfish Allergy Hives   Acetaminophen Rash   Demerol [Meperidine] Hives   Fish Oil Hives   Latex Rash   Morphine Rash   Pork-Derived Products Hives   Tomato Hives    Level of Care/Admitting Diagnosis ED Disposition     ED Disposition  Admit   Condition  --   Comment  Hospital Area: [facility] [[id number]]  Level of Care: Med-Surg [16]  May place patient in observation at [facility] or [facility] if equivalent level of care is available:: No  Covid Evaluation: Asymptomatic Screening Protocol (No Symptoms)  Diagnosis: Cholecystitis with cholelithiasis [073710]  Admitting Physician: CCS, MD [[id number]]  Attending Physician: CCS, MD [[id number]]  Bed request comments: 3 East          B Medical/Surgery History Past Medical History:  Diagnosis Date   Allergy    ANXIETY 11/11/2006   Qualifier: Diagnosis of  By: Jonny Ruiz MD, Len Blalock    Asthma    CHOLELITHIASIS 11/08/2006   Qualifier: Diagnosis of  By: Maris Berger    DEPRESSION 11/11/2006   Qualifier: Diagnosis of  By: Jonny Ruiz MD, Len Blalock    Generalized headaches    Hyperlipidemia 10/03/2014   HYPERTENSION 11/08/2006   Qualifier: Diagnosis of  By: Maris Berger    Kidney stones    Pulmonary embolism (HCC) 65 yo    on  BCP   UTI (lower urinary tract infection)    Past Surgical History:  Procedure Laterality Date   ABDOMINAL HYSTERECTOMY     COLONOSCOPY     NASAL SINUS SURGERY     TUBAL LIGATION       A IV Location/Drains/Wounds Patient Lines/Drains/Airways Status     Active Line/Drains/Airways     Name Placement date Placement time Site Days   Peripheral IV 02/12/21 20 G Left Antecubital 02/12/21  2334  Antecubital  1            Intake/Output Last 24 hours No intake or output data in the 24 hours ending 02/13/21 0134  Labs/Imaging Results for orders placed or performed during the hospital encounter of 02/12/21 (from the past 48 hour(s))  Lipase, blood     Status: None   Collection Time: 02/12/21  6:48 PM  Result Value Ref Range   Lipase 28 11 - 51 U/L    Comment: Performed at [facility], [address] [place], [place] [address]  Comprehensive metabolic panel     Status: Abnormal   Collection Time: 02/12/21  6:48 PM  Result Value Ref Range   Sodium 135 135 - 145 mmol/L   Potassium 3.3 (L) 3.5 - 5.1 mmol/L   Chloride 105 98 - 111 mmol/L   CO2 22 22 - 32 mmol/L   Glucose, Bld 105 (H) 70 - 99  mg/dL    Comment: Glucose reference range applies only to samples taken after fasting for at least 8 hours.   BUN 10 8 - 23 mg/dL   Creatinine, Ser 8.34 0.44 - 1.00 mg/dL   Calcium 9.0 8.9 - 19.6 mg/dL   Total Protein 7.7 6.5 - 8.1 g/dL   Albumin 4.0 3.5 - 5.0 g/dL   AST 26 15 - 41 U/L   ALT 28 0 - 44 U/L   Alkaline Phosphatase 146 (H) 38 - 126 U/L   Total Bilirubin 0.7 0.3 - 1.2 mg/dL   GFR, Estimated >22 >29 mL/min    Comment: (NOTE) Calculated using the CKD-EPI Creatinine Equation (2021)    Anion gap 8 5 - 15    Comment: Performed at [facility], [address] [place], [place] [address]  CBC     Status: None   Collection Time: 02/12/21  6:48 PM  Result Value Ref Range   WBC 6.8 4.0 - 10.5 K/uL   RBC 4.13 3.87 - 5.11 MIL/uL   Hemoglobin  12.7 12.0 - 15.0 g/dL   HCT 11.9 41.7 - 40.8 %   MCV 94.7 80.0 - 100.0 fL   MCH 30.8 26.0 - 34.0 pg   MCHC 32.5 30.0 - 36.0 g/dL   RDW 14.4 81.8 - 56.3 %   Platelets 224 150 - 400 K/uL   nRBC 0.0 0.0 - 0.2 %    Comment: Performed at [facility], [address] [place], [place] [address]  Troponin I (High Sensitivity)     Status: None   Collection Time: 02/12/21  6:48 PM  Result Value Ref Range   Troponin I (High Sensitivity) 3 <18 ng/L    Comment: (NOTE) Elevated high sensitivity troponin I (hsTnI) values and significant  changes across serial measurements may suggest ACS but many other  chronic and acute conditions are known to elevate hsTnI results.  Refer to the "Links" section for chest pain algorithms and additional  guidance. Performed at [facility], [address] [place], [place] [address]   Troponin I (High Sensitivity)     Status: None   Collection Time: 02/12/21  8:25 PM  Result Value Ref Range   Troponin I (High Sensitivity) 4 <18 ng/L    Comment: (NOTE) Elevated high sensitivity troponin I (hsTnI) values and significant  changes across serial measurements may suggest ACS but many other  chronic and acute conditions are known to elevate hsTnI results.  Refer to the "Links" section for chest pain algorithms and additional  guidance. Performed at [facility], [address] [place], [place] [address]   Urinalysis, Routine w reflex microscopic Urine, Clean Catch     Status: Abnormal   Collection Time: 02/12/21 10:02 PM  Result Value Ref Range   Color, Urine YELLOW YELLOW   APPearance HAZY (A) CLEAR   Specific Gravity, Urine 1.011 1.005 - 1.030   pH 6.0 5.0 - 8.0   Glucose, UA NEGATIVE NEGATIVE mg/dL   Hgb urine dipstick SMALL (A) NEGATIVE   Bilirubin Urine NEGATIVE NEGATIVE   Ketones, ur NEGATIVE NEGATIVE mg/dL   Protein, ur NEGATIVE NEGATIVE mg/dL   Nitrite NEGATIVE NEGATIVE   Leukocytes,Ua  NEGATIVE NEGATIVE   RBC / HPF 0-5 0 - 5 RBC/hpf   WBC, UA 0-5 0 - 5 WBC/hpf   Bacteria, UA NONE SEEN NONE SEEN   Squamous Epithelial / LPF 6-10 0 - 5   Mucus PRESENT  Comment: Performed at [facility], [address] [place], [place] [address]   DG Chest 2 View  Result Date: 02/12/2021 CLINICAL DATA:  Chest pain radiating to the upper abdomen. EXAM: CHEST - 2 VIEW COMPARISON:  06/15/2013 FINDINGS: The heart size and mediastinal contours are within normal limits. Both lungs are clear. The visualized skeletal structures are unremarkable. IMPRESSION: No active cardiopulmonary disease. Electronically Signed   By: Burman Nieves M.D.   On: 02/12/2021 19:44   US Abdomen Limited RUQ (LIVER/GB)  Result Date: 02/12/2021 CLINICAL DATA:  Right upper quadrant pain EXAM: ULTRASOUND ABDOMEN LIMITED RIGHT UPPER QUADRANT COMPARISON:  CT 06/30/2006 FINDINGS: Gallbladder: Several stones in the gallbladder, largest measuring 9 mm in diameter and located in the gallbladder neck. Gallbladder sludge is present. Murphy's sign is positive. Echogenic foci in the gallbladder wall with ring down artifact consistent with cholesterolosis. Common bile duct: Diameter: 6 mm, normal Liver: No focal lesion identified. Within normal limits in parenchymal echogenicity. Portal vein is patent on color Doppler imaging with normal direction of blood flow towards the liver. Other: None. IMPRESSION: 1. Cholelithiasis with gallbladder sludge and positive Murphy's sign. Changes suggest acute cholecystitis in the appropriate clinical setting. 2. Findings of gallbladder cholesterolosis. Electronically Signed   By: Burman Nieves M.D.   On: 02/12/2021 19:43    Pending Labs Unresulted Labs (From admission, onward)     Start     Ordered   02/13/21 0018  HIV Antibody (routine testing w rflx)  (HIV Antibody (Routine testing w reflex) panel)  Once,   R        02/13/21 0020   02/12/21 2258  Resp Panel by RT-PCR (Flu  A&B, Covid) Nasopharyngeal Swab  (Tier 2 - Symptomatic/asymptomatic)  Once,   STAT        02/12/21 2257            Vitals/Pain Today's Vitals   02/12/21 2034 02/12/21 2300 02/12/21 2315 02/13/21 0004  BP: (!) 141/89 118/63 118/63   Pulse: 84 96 88   Resp: 17 20 18    Temp:      TempSrc:      SpO2: 98% 100% 100%   Weight:      Height:      PainSc:    9     Isolation Precautions No active isolations  Medications Medications  dextrose 5 % and 0.45 % NaCl with KCl 20 mEq/L infusion (has no administration in time range)  acetaminophen (TYLENOL) tablet 650 mg (has no administration in time range)    Or  acetaminophen (TYLENOL) suppository 650 mg (has no administration in time range)  oxyCODONE (Oxy IR/ROXICODONE) immediate release tablet 5-10 mg (has no administration in time range)  HYDROmorphone (DILAUDID) injection 1 mg (has no administration in time range)  ondansetron (ZOFRAN-ODT) disintegrating tablet 4 mg (has no administration in time range)    Or  ondansetron (ZOFRAN) injection 4 mg (has no administration in time range)  ondansetron (ZOFRAN-ODT) disintegrating tablet 4 mg (4 mg Oral Given 02/12/21 1855)  HYDROmorphone (DILAUDID) injection 0.5 mg (0.5 mg Intravenous Given 02/12/21 2339)  ondansetron (ZOFRAN) injection 4 mg (4 mg Intravenous Given 02/12/21 2340)  cefTRIAXone (ROCEPHIN) 2 g in sodium chloride 0.9 % 100 mL IVPB (2 g Intravenous New Bag/Given 02/12/21 2341)    Mobility walks Low fall risk   Focused Assessments   R Recommendations: See Admitting Provider Note  Report given to:   Additional Notes:

## 2021-02-14 ENCOUNTER — Encounter (HOSPITAL_COMMUNITY): Payer: Self-pay | Admitting: Surgery

## 2021-02-14 LAB — COMPREHENSIVE METABOLIC PANEL
ALT: 95 U/L — ABNORMAL HIGH (ref 0–44)
AST: 103 U/L — ABNORMAL HIGH (ref 15–41)
Albumin: 3.2 g/dL — ABNORMAL LOW (ref 3.5–5.0)
Alkaline Phosphatase: 127 U/L — ABNORMAL HIGH (ref 38–126)
Anion gap: 6 (ref 5–15)
BUN: 13 mg/dL (ref 8–23)
CO2: 20 mmol/L — ABNORMAL LOW (ref 22–32)
Calcium: 8.1 mg/dL — ABNORMAL LOW (ref 8.9–10.3)
Chloride: 111 mmol/L (ref 98–111)
Creatinine, Ser: 0.98 mg/dL (ref 0.44–1.00)
GFR, Estimated: >60 mL/min (ref >60)
Glucose, Bld: 135 mg/dL — ABNORMAL HIGH (ref 70–99)
Potassium: 3.4 mmol/L — ABNORMAL LOW (ref 3.5–5.1)
Sodium: 137 mmol/L (ref 135–145)
Total Bilirubin: 0.5 mg/dL (ref 0.3–1.2)
Total Protein: 6.5 g/dL (ref 6.5–8.1)

## 2021-02-14 LAB — CBC
HCT: 32.2 % — ABNORMAL LOW (ref 36.0–46.0)
Hemoglobin: 10.8 g/dL — ABNORMAL LOW (ref 12.0–15.0)
MCH: 31.8 pg (ref 26.0–34.0)
MCHC: 33.5 g/dL (ref 30.0–36.0)
MCV: 94.7 fL (ref 80.0–100.0)
Platelets: 171 10*3/uL (ref 150–400)
RBC: 3.4 MIL/uL — ABNORMAL LOW (ref 3.87–5.11)
RDW: 13.2 % (ref 11.5–15.5)
WBC: 9.8 10*3/uL (ref 4.0–10.5)
nRBC: 0 % (ref 0.0–0.2)

## 2021-02-14 LAB — SURGICAL PATHOLOGY

## 2021-02-14 LAB — MAGNESIUM: Magnesium: 2 mg/dL (ref 1.7–2.4)

## 2021-02-14 MED ORDER — KETOROLAC TROMETHAMINE 15 MG/ML IJ SOLN: 15.0000 mg | Freq: Four times a day (QID) | INTRAMUSCULAR | Status: DC | PRN

## 2021-02-14 MED ORDER — POTASSIUM CHLORIDE 20 MEQ PO PACK
40.0000 meq | PACK | Freq: Two times a day (BID) | ORAL | Status: AC
  Administered 2021-02-14 (×2): 40 meq via ORAL
  Filled 2021-02-14 (×2): qty 2

## 2021-02-14 MED ORDER — IBUPROFEN 400 MG PO TABS
600.0000 mg | ORAL_TABLET | Freq: Four times a day (QID) | ORAL | Status: DC
  Administered 2021-02-14 – 2021-02-15 (×4): 600 mg via ORAL
  Filled 2021-02-14 (×4): qty 1

## 2021-02-14 MED ORDER — HYDROMORPHONE HCL 1 MG/ML IJ SOLN
1.0000 mg | INTRAMUSCULAR | Status: DC | PRN
  Administered 2021-02-14: 14:00:00 1 mg via INTRAVENOUS
  Filled 2021-02-14: qty 1

## 2021-02-14 MED ORDER — OXYCODONE HCL 5 MG PO TABS
5.0000 mg | ORAL_TABLET | ORAL | Status: DC | PRN
  Administered 2021-02-14 – 2021-02-15 (×2): 10 mg via ORAL
  Filled 2021-02-14 (×3): qty 2

## 2021-02-14 MED ORDER — HYDROMORPHONE HCL 1 MG/ML IJ SOLN: 1.0000 mg | INTRAMUSCULAR | Status: DC | PRN

## 2021-02-14 MED ORDER — METHOCARBAMOL 500 MG PO TABS
500.0000 mg | ORAL_TABLET | Freq: Four times a day (QID) | ORAL | Status: DC | PRN
  Administered 2021-02-15: 500 mg via ORAL
  Filled 2021-02-14: qty 1

## 2021-02-14 MED ORDER — PHENYLEPHRINE HCL-NACL 20-0.9 MG/250ML-% IV SOLN
INTRAVENOUS | Status: AC
  Filled 2021-02-14: qty 250

## 2021-02-14 NOTE — Progress Notes (Signed)
Patient ID: Deborah Pratt, female   DOB: [birth year], 65 y.o.   MRN: [id number] [facility] Progress Note  1 Day Post-Op  Subjective: CC-  Having upper abdominal pain this morning, points RUQ and epigastric region. Pain is different than prior to surgery. She also reports some mild nausea, no emesis. Just ate 100% of her breakfast. States that she feels dizzy, this is when she is sitting and when she gets up to mobilize.   Objective: Vital signs in last 24 hours: Temp:  [97.9 F (36.6 C)-99.1 F (37.3 C)] 98.3 F (36.8 C) (12/30 0518) Pulse Rate:  [65-104] 95 (12/30 0518) Resp:  [16-20] 16 (12/30 0518) BP: (113-149)/(67-97) 113/89 (12/30 0518) SpO2:  [92 %-97 %] 94 % (12/30 0518) Weight:  [82.6 kg] 82.6 kg (12/29 0941) Last BM Date: 02/12/21  Intake/Output from previous day: 12/29 0701 - 12/30 0700 In: 3415 [P.O.:1200; I.V.:2095; IV Piggyback:120] Out: 1110 [Urine:1100; Blood:10] Intake/Output this shift: No intake/output data recorded.  PE: Gen:  Alert, NAD, pleasant HEENT: EOM's intact, pupils equal and round Card:  RRR Pulm:  rate and effort normal on room air Abd: Soft, mild distension, hypoactive BS, cdi dressings to incisions, mild upper abdominal TTP in the epigastric region and RUQ, no peritonitis  Lab Results:  Recent Labs    02/12/21 1848  WBC 6.8  HGB 12.7  HCT 39.1  PLT 224   BMET Recent Labs    02/12/21 1848  NA 135  K 3.3*  CL 105  CO2 22  GLUCOSE 105*  BUN 10  CREATININE 0.82  CALCIUM 9.0   PT/INR No results for input(s): LABPROT, INR in the last 72 hours. CMP     Component Value Date/Time   NA 135 02/12/2021 1848   K 3.3 (L) 02/12/2021 1848   CL 105 02/12/2021 1848   CO2 22 02/12/2021 1848   GLUCOSE 105 (H) 02/12/2021 1848   BUN 10 02/12/2021 1848   CREATININE 0.82 02/12/2021 1848   CREATININE 0.90 09/14/2015 0927   CALCIUM 9.0 02/12/2021 1848   PROT 7.7 02/12/2021 1848   ALBUMIN 4.0 02/12/2021 1848   AST  26 02/12/2021 1848   ALT 28 02/12/2021 1848   ALKPHOS 146 (H) 02/12/2021 1848   BILITOT 0.7 02/12/2021 1848   GFRNONAA >60 02/12/2021 1848   GFRNONAA 70 09/14/2015 0927   GFRAA 80 09/14/2015 0927   Lipase     Component Value Date/Time   LIPASE 28 02/12/2021 1848       Studies/Results: DG Chest 2 View  Result Date: 02/12/2021 CLINICAL DATA:  Chest pain radiating to the upper abdomen. EXAM: CHEST - 2 VIEW COMPARISON:  06/15/2013 FINDINGS: The heart size and mediastinal contours are within normal limits. Both lungs are clear. The visualized skeletal structures are unremarkable. IMPRESSION: No active cardiopulmonary disease. Electronically Signed   By: Burman Nieves M.D.   On: 02/12/2021 19:44   DG Cholangiogram Operative  Result Date: 02/13/2021 CLINICAL DATA:  Cholecystectomy for symptomatic cholelithiasis. EXAM: INTRAOPERATIVE CHOLANGIOGRAM TECHNIQUE: Cholangiographic images from the C-arm fluoroscopic device were submitted for interpretation post-operatively. Please see the procedural report for the amount of contrast and the fluoroscopy time utilized. COMPARISON:  Right upper quadrant abdominal ultrasound on 02/12/2021 FINDINGS: Intraoperative imaging with a C-arm demonstrates normal opacified bile ducts without evidence of biliary dilatation, stricture, filling defect or contrast extravasation. Contrast enters the duodenum normally. IMPRESSION: Normal intraoperative cholangiogram. Electronically Signed   By: Irish Lack M.D.   On: 02/13/2021 11:07  US Abdomen Limited RUQ (LIVER/GB)  Result Date: 02/12/2021 CLINICAL DATA:  Right upper quadrant pain EXAM: ULTRASOUND ABDOMEN LIMITED RIGHT UPPER QUADRANT COMPARISON:  CT 06/30/2006 FINDINGS: Gallbladder: Several stones in the gallbladder, largest measuring 9 mm in diameter and located in the gallbladder neck. Gallbladder sludge is present. Murphy's sign is positive. Echogenic foci in the gallbladder wall with ring down artifact  consistent with cholesterolosis. Common bile duct: Diameter: 6 mm, normal Liver: No focal lesion identified. Within normal limits in parenchymal echogenicity. Portal vein is patent on color Doppler imaging with normal direction of blood flow towards the liver. Other: None. IMPRESSION: 1. Cholelithiasis with gallbladder sludge and positive Murphy's sign. Changes suggest acute cholecystitis in the appropriate clinical setting. 2. Findings of gallbladder cholesterolosis. Electronically Signed   By: Burman Nieves M.D.   On: 02/12/2021 19:43    Anti-infectives: Anti-infectives (From admission, onward)    Start     Dose/Rate Route Frequency Ordered Stop   02/13/21 1021  sodium chloride 0.9 % with cefTRIAXone (ROCEPHIN) ADS Med       Note to Pharmacy: Mirian Mo J: cabinet override      02/13/21 1021 02/13/21 2229   02/12/21 2300  cefTRIAXone (ROCEPHIN) 2 g in sodium chloride 0.9 % 100 mL IVPB        2 g 200 mL/hr over 30 Minutes Intravenous  Once 02/12/21 2257 02/13/21 0135        Assessment/Plan Acute on Chronic Calculus Cholecystitis POD#1 s/p SINGLE SITE Laparoscopic cholecystectomy with intraoperative cholangiogram 12/29 Dr. Michaell Cowing - IOC negative - Check CBC, CMP, magnesium. If h/h stable dizziness could be from pain medications - decrease robaxin, schedule ibuprofen for better pain control. Simethicone for gas pains. Needs to mobilize. Will recheck later today to see if she is ready for discharge.   ID - rocephin 12/28>>12/29 FEN - IVF, soft diet VTE - SCDs, will start lovenox if h/h stable Foley - none    LOS: 0 days    Franne Forts, [facility] 02/14/2021, 8:26 AM Please see Amion for pager number during day hours 7:00am-4:30pm

## 2021-02-15 LAB — COMPREHENSIVE METABOLIC PANEL
ALT: 103 U/L — ABNORMAL HIGH (ref 0–44)
AST: 95 U/L — ABNORMAL HIGH (ref 15–41)
Albumin: 3 g/dL — ABNORMAL LOW (ref 3.5–5.0)
Alkaline Phosphatase: 128 U/L — ABNORMAL HIGH (ref 38–126)
Anion gap: 7 (ref 5–15)
BUN: 11 mg/dL (ref 8–23)
CO2: 23 mmol/L (ref 22–32)
Calcium: 8.2 mg/dL — ABNORMAL LOW (ref 8.9–10.3)
Chloride: 111 mmol/L (ref 98–111)
Creatinine, Ser: 0.77 mg/dL (ref 0.44–1.00)
GFR, Estimated: >60 mL/min (ref >60)
Glucose, Bld: 102 mg/dL — ABNORMAL HIGH (ref 70–99)
Potassium: 4.3 mmol/L (ref 3.5–5.1)
Sodium: 141 mmol/L (ref 135–145)
Total Bilirubin: 0.6 mg/dL (ref 0.3–1.2)
Total Protein: 6.1 g/dL — ABNORMAL LOW (ref 6.5–8.1)

## 2021-02-15 LAB — CBC
HCT: 31.8 % — ABNORMAL LOW (ref 36.0–46.0)
Hemoglobin: 10.3 g/dL — ABNORMAL LOW (ref 12.0–15.0)
MCH: 31.1 pg (ref 26.0–34.0)
MCHC: 32.4 g/dL (ref 30.0–36.0)
MCV: 96.1 fL (ref 80.0–100.0)
Platelets: 160 10*3/uL (ref 150–400)
RBC: 3.31 MIL/uL — ABNORMAL LOW (ref 3.87–5.11)
RDW: 13.2 % (ref 11.5–15.5)
WBC: 7.1 10*3/uL (ref 4.0–10.5)
nRBC: 0 % (ref 0.0–0.2)

## 2021-02-15 MED ORDER — METHOCARBAMOL 500 MG PO TABS: 500.0000 mg | ORAL_TABLET | Freq: Four times a day (QID) | ORAL | 1 refills | Status: DC | PRN

## 2021-02-15 MED ORDER — OXYCODONE HCL 5 MG PO TABS
5.0000 mg | ORAL_TABLET | Freq: Four times a day (QID) | ORAL | 0 refills | Status: DC | PRN
Start: 2021-02-15 — End: 2021-05-01

## 2021-02-15 NOTE — Progress Notes (Signed)
Discharge instructions given to patient and all questions were answered.  

## 2021-02-15 NOTE — TOC Transition Note (Signed)
Transition of Care [facility]) - CM/SW Discharge Note   Patient Details  Name: Deborah Pratt MRN: [id number] Date of Birth: 08-15-1955  [facility]) CM/SW Contact:  Golda Acre, RN Phone Number: 02/15/2021, 9:25 AM   Clinical Narrative:    Dcd to home with self care no toc needs present   Final next level of care: Home/Self Care Barriers to Discharge: No Barriers Identified   Patient Goals and CMS Choice Patient states their goals for this hospitalization and ongoing recovery are:: to go home CMS Medicare.gov Compare Post Acute Care list provided to:: Patient Choice offered to / list presented to : Patient  Discharge Placement                       Discharge Plan and Services   Discharge Planning Services: CM Consult                                 Social Determinants of Health (SDOH) Interventions     Readmission Risk Interventions No flowsheet data found.

## 2021-02-15 NOTE — Progress Notes (Signed)
2 Days Post-Op   Subjective/Chief Complaint: Feels much better today. Ready to go home   Objective: Vital signs in last 24 hours: Temp:  [97.8 F (36.6 C)-98.6 F (37 C)] 98 F (36.7 C) (12/30 2103) Pulse Rate:  [83-94] 83 (12/31 0553) Resp:  [16-17] 17 (12/30 2103) BP: (113-128)/(69-78) 123/78 (12/31 0553) SpO2:  [93 %-96 %] 93 % (12/31 0553) Last BM Date: 02/14/21  Intake/Output from previous day: 12/30 0701 - 12/31 0700 In: 1637.9 [P.O.:720; I.V.:917.9] Out: 1000 [Urine:1000] Intake/Output this shift: No intake/output data recorded.  General appearance: alert and cooperative Resp: clear to auscultation bilaterally Cardio: regular rate and rhythm GI: soft, minimal tenderness  Lab Results:  Recent Labs    02/14/21 0844 02/15/21 0511  WBC 9.8 7.1  HGB 10.8* 10.3*  HCT 32.2* 31.8*  PLT 171 160   BMET Recent Labs    02/14/21 0844 02/15/21 0511  NA 137 141  K 3.4* 4.3  CL 111 111  CO2 20* 23  GLUCOSE 135* 102*  BUN 13 11  CREATININE 0.98 0.77  CALCIUM 8.1* 8.2*   PT/INR No results for input(s): LABPROT, INR in the last 72 hours. ABG No results for input(s): PHART, HCO3 in the last 72 hours.  Invalid input(s): PCO2, PO2  Studies/Results: DG Cholangiogram Operative  Result Date: 02/13/2021 CLINICAL DATA:  Cholecystectomy for symptomatic cholelithiasis. EXAM: INTRAOPERATIVE CHOLANGIOGRAM TECHNIQUE: Cholangiographic images from the C-arm fluoroscopic device were submitted for interpretation post-operatively. Please see the procedural report for the amount of contrast and the fluoroscopy time utilized. COMPARISON:  Right upper quadrant abdominal ultrasound on 02/12/2021 FINDINGS: Intraoperative imaging with a C-arm demonstrates normal opacified bile ducts without evidence of biliary dilatation, stricture, filling defect or contrast extravasation. Contrast enters the duodenum normally. IMPRESSION: Normal intraoperative cholangiogram. Electronically Signed   By:  Irish Lack M.D.   On: 02/13/2021 11:07    Anti-infectives: Anti-infectives (From admission, onward)    Start     Dose/Rate Route Frequency Ordered Stop   02/13/21 1021  sodium chloride 0.9 % with cefTRIAXone (ROCEPHIN) ADS Med       Note to Pharmacy: Mirian Mo J: cabinet override      02/13/21 1021 02/13/21 2229   02/12/21 2300  cefTRIAXone (ROCEPHIN) 2 g in sodium chloride 0.9 % 100 mL IVPB        2 g 200 mL/hr over 30 Minutes Intravenous  Once 02/12/21 2257 02/13/21 0135       Assessment/Plan: s/p Procedure(s): LAPAROSCOPIC CHOLECYSTECTOMY SINGLE SITE WITH INTRAOPERATIVE CHOLANGIOGRAM (N/A) Advance diet Discharge  LOS: 0 days    Chevis Pretty III 02/15/2021

## 2021-02-24 NOTE — Discharge Summary (Signed)
[facility] Discharge Summary   Patient ID: Deborah Pratt MRN: [id number] DOB/AGE: 67/06/1955 67 y.o.  Admit date: 02/12/2021 Discharge date: 02/15/2021  Admitting Diagnosis: Cholecystitis   Discharge Diagnosis Patient Active Problem List   Diagnosis Date Noted   Cholecystitis with cholelithiasis 02/13/2021   Hyperlipidemia 10/03/2014   Daytime somnolence 07/26/2014   Snoring 03/03/2012   Kidney stones    Allergic rhinitis, cause unspecified    Asthma    Pulmonary embolism [facility])    Preventative health care 08/27/2011   HYPERSOMNIA 04/16/2008   ANXIETY 11/11/2006   Depression 11/11/2006   Essential hypertension 11/08/2006   Cholelithiasis with cholecystitis 11/08/2006   Headache(784.0) 11/08/2006    Consultants None  Imaging: No results found.  Procedures Dr. Michaell Cowing (02/13/2022) - SINGLE SITE Laparoscopic cholecystectomy with intraoperative cholangiogram  Hospital Course:  Deborah Pratt is a 66yo female who was admitted to [facility] 02/12/21 with progressive abdominal pain, nausea, and vomiting.  Workup showed acute cholecystitis.  Patient was admitted and underwent procedure listed above.  Intraoperatively she was found to have a very enlarged gallbladder with significant thickening, chronic adhesions with acute edema consistent with acute on chronic cholecystitis; cholangiogram showed classic biliary anatomy without any evidence of leak, obstruction, choledocholithiasis. Tolerated procedure well and was transferred to the floor.  Diet was advanced as tolerated.  Pain control initially difficult but improved with time and multimodal therapies. On POD2 the patient was voiding well, tolerating diet, ambulating well, pain well controlled, vital signs stable, incisions c/d/i and felt stable for discharge home.  Patient will follow up as below and knows to call with questions or concerns.      Allergies as of 02/15/2021       Reactions    Other Anaphylaxis   *Potatoes*    Peanut-containing Drug Products Hives   Shellfish Allergy Hives   Acetaminophen Rash   Demerol [meperidine] Hives   Fish Oil Hives   Latex Rash   Morphine Rash   Pork-derived Products Hives   Tomato Hives        Medication List     TAKE these medications    diphenhydrAMINE 25 MG tablet Commonly known as: BENADRYL Take 1 tablet (25 mg total) by mouth every 6 (six) hours.   famotidine 20 MG tablet Commonly known as: PEPCID Take 1 tablet (20 mg total) by mouth 2 (two) times daily.   methocarbamol 500 MG tablet Commonly known as: ROBAXIN Take 1 tablet (500 mg total) by mouth every 6 (six) hours as needed for muscle spasms.   oxyCODONE 5 MG immediate release tablet Commonly known as: Roxicodone Take 1 tablet (5 mg total) by mouth every 6 (six) hours as needed for severe pain.   predniSONE 50 MG tablet Commonly known as: DELTASONE Take 1 tablet (50 mg total) by mouth daily with breakfast.          Follow-up Information     [facility], [facility]. Go on 03/13/2021.   Specialty: General Surgery Why: 1:45 PM. Please arrive 30 min early to check in and have ID and insurance card with you. Contact information: [address] [place] [address] [phone]                  Signed: Franne Forts, [facility] 02/24/2021, 10:38 AM Please see Amion for pager number during day hours 7:00am-4:30pm

## 2021-05-01 ENCOUNTER — Encounter (HOSPITAL_COMMUNITY): Payer: Self-pay | Admitting: Emergency Medicine

## 2021-05-01 ENCOUNTER — Emergency Department (HOSPITAL_COMMUNITY)
Admission: EM | Admit: 2021-05-01 | Discharge: 2021-05-01 | Disposition: A | Discharge status: Home / Self Care | Payer: Commercial Managed Care - PPO | Attending: Emergency Medicine | Admitting: Emergency Medicine

## 2021-05-01 ENCOUNTER — Other Ambulatory Visit: Payer: Self-pay

## 2021-05-01 ENCOUNTER — Emergency Department (HOSPITAL_COMMUNITY): Payer: Commercial Managed Care - PPO

## 2021-05-01 DIAGNOSIS — Z91040 Latex allergy status: Secondary | ICD-10-CM | POA: Insufficient documentation

## 2021-05-01 DIAGNOSIS — M7989 Other specified soft tissue disorders: Secondary | ICD-10-CM | POA: Insufficient documentation

## 2021-05-01 DIAGNOSIS — M79604 Pain in right leg: Secondary | ICD-10-CM | POA: Diagnosis not present

## 2021-05-01 DIAGNOSIS — M25561 Pain in right knee: Secondary | ICD-10-CM | POA: Insufficient documentation

## 2021-05-01 LAB — COMPREHENSIVE METABOLIC PANEL
ALT: 117 U/L — ABNORMAL HIGH (ref 0–44)
AST: 70 U/L — ABNORMAL HIGH (ref 15–41)
Albumin: 3.6 g/dL (ref 3.5–5.0)
Alkaline Phosphatase: 227 U/L — ABNORMAL HIGH (ref 38–126)
Anion gap: 9 (ref 5–15)
BUN: 13 mg/dL (ref 8–23)
CO2: 23 mmol/L (ref 22–32)
Calcium: 8.5 mg/dL — ABNORMAL LOW (ref 8.9–10.3)
Chloride: 107 mmol/L (ref 98–111)
Creatinine, Ser: 0.8 mg/dL (ref 0.44–1.00)
GFR, Estimated: >60 mL/min (ref >60)
Glucose, Bld: 87 mg/dL (ref 70–99)
Potassium: 3.1 mmol/L — ABNORMAL LOW (ref 3.5–5.1)
Sodium: 139 mmol/L (ref 135–145)
Total Bilirubin: 0.4 mg/dL (ref 0.3–1.2)
Total Protein: 7.3 g/dL (ref 6.5–8.1)

## 2021-05-01 LAB — CBC WITH DIFFERENTIAL/PLATELET
Abs Immature Granulocytes: 0.01 10*3/uL (ref 0.00–0.07)
Basophils Absolute: 0 10*3/uL (ref 0.0–0.1)
Basophils Relative: 0 %
Eosinophils Absolute: 0.3 10*3/uL (ref 0.0–0.5)
Eosinophils Relative: 5 %
HCT: 37.1 % (ref 36.0–46.0)
Hemoglobin: 12.2 g/dL (ref 12.0–15.0)
Immature Granulocytes: 0 %
Lymphocytes Relative: 33 %
Lymphs Abs: 1.8 10*3/uL (ref 0.7–4.0)
MCH: 30.9 pg (ref 26.0–34.0)
MCHC: 32.9 g/dL (ref 30.0–36.0)
MCV: 93.9 fL (ref 80.0–100.0)
Monocytes Absolute: 0.6 10*3/uL (ref 0.1–1.0)
Monocytes Relative: 10 %
Neutro Abs: 2.8 10*3/uL (ref 1.7–7.7)
Neutrophils Relative %: 52 %
Platelets: 227 10*3/uL (ref 150–400)
RBC: 3.95 MIL/uL (ref 3.87–5.11)
RDW: 12.5 % (ref 11.5–15.5)
WBC: 5.5 10*3/uL (ref 4.0–10.5)
nRBC: 0 % (ref 0.0–0.2)

## 2021-05-01 MED ORDER — POTASSIUM CHLORIDE CRYS ER 20 MEQ PO TBCR
40.0000 meq | EXTENDED_RELEASE_TABLET | Freq: Once | ORAL | Status: AC
  Administered 2021-05-01: 40 meq via ORAL
  Filled 2021-05-01: qty 2

## 2021-05-01 MED ORDER — APIXABAN 5 MG PO TABS
10.0000 mg | ORAL_TABLET | Freq: Once | ORAL | Status: AC
  Administered 2021-05-01: 10 mg via ORAL
  Filled 2021-05-01: qty 2

## 2021-05-01 MED ORDER — OXYCODONE HCL 5 MG PO TABS
5.0000 mg | ORAL_TABLET | Freq: Once | ORAL | Status: AC
  Administered 2021-05-01: 5 mg via ORAL
  Filled 2021-05-01: qty 1

## 2021-05-01 MED ORDER — OXYCODONE HCL 5 MG PO TABS: 5.0000 mg | ORAL_TABLET | ORAL | 0 refills | Status: DC | PRN

## 2021-05-01 MED ORDER — ENOXAPARIN SODIUM 100 MG/ML IJ SOSY: 1.0000 mg/kg | PREFILLED_SYRINGE | Freq: Once | INTRAMUSCULAR | Status: DC

## 2021-05-01 NOTE — ED Provider Notes (Signed)
?[facility]-EMERGENCY DEPT ?Provider Note ? ? ?CSN: [id number] ?Arrival date & time: 05/01/21  1846 ? ?  ? ?History ? ?Chief Complaint  ?Patient presents with  ? Leg Pain  ? ? ?Deborah Pratt is a 66 y.o. female. ? ?HPI ?66 year old female presents with right leg pain and swelling.  She noticed pain behind her right knee starting about 5 days ago.  She noticed the swelling today though she has not actually checked before this so she is not sure if it was there before.  She has had progressively worsening pain.  Her entire calf is swollen and painful.  A little bit of redness anteriorly.  No fevers, shortness of breath, or chest pain.  She has been having intermittent cramping in her legs ever since her surgery in December but this is more sustained.  No injuries or knee pain. Pain is so bad she can't walk on it. ? ?Home Medications ?Prior to Admission medications   ?Medication Sig Start Date End Date Taking? Authorizing Provider  ?oxyCODONE (ROXICODONE) 5 MG immediate release tablet Take 1 tablet (5 mg total) by mouth every 4 (four) hours as needed for severe pain. 05/01/21  Yes Pricilla LovelessGoldston, Vanisha Whiten, MD  ?diphenhydrAMINE (BENADRYL) 25 MG tablet Take 1 tablet (25 mg total) by mouth every 6 (six) hours. ?Patient not taking: Reported on 02/12/2021 01/07/17   Emi HolesLaw, Alexandra M, PA-C  ?famotidine (PEPCID) 20 MG tablet Take 1 tablet (20 mg total) by mouth 2 (two) times daily. ?Patient not taking: Reported on 02/12/2021 01/07/17   Emi HolesLaw, Alexandra M, PA-C  ?methocarbamol (ROBAXIN) 500 MG tablet Take 1 tablet (500 mg total) by mouth every 6 (six) hours as needed for muscle spasms. 02/15/21   Griselda Mineroth, Paul III, MD  ?predniSONE (DELTASONE) 50 MG tablet Take 1 tablet (50 mg total) by mouth daily with breakfast. ?Patient not taking: Reported on 02/12/2021 01/07/17   Emi HolesLaw, Alexandra M, PA-C  ?   ? ?Allergies    ?Other, Peanut-containing drug products, Shellfish allergy, Acetaminophen, Demerol [meperidine], Fish  oil, Latex, Morphine, Pork-derived products, and Tomato   ? ?Review of Systems   ?Review of Systems  ?Respiratory:  Negative for shortness of breath.   ?Cardiovascular:  Positive for leg swelling. Negative for chest pain.  ? ?Physical Exam ?Updated Vital Signs ?BP (!) 152/86 (BP Location: Right Arm)   Pulse 88   Temp 98.2 ?F (36.8 ?C) (Oral)   Resp 18   Ht 5\' 8"  (1.727 m)   Wt 83 kg   SpO2 100%   BMI 27.82 kg/m?  ?Physical Exam ?Vitals and nursing note reviewed.  ?Constitutional:   ?   Appearance: She is well-developed.  ?HENT:  ?   Head: Normocephalic and atraumatic.  ?Cardiovascular:  ?   Rate and Rhythm: Normal rate and regular rhythm.  ?   Pulses:     ?     Posterior tibial pulses are 2+ on the right side.  ?Pulmonary:  ?   Effort: Pulmonary effort is normal.  ?Abdominal:  ?   General: There is no distension.  ?Musculoskeletal:  ?   Right knee: No tenderness.  ?   Right lower leg: Swelling and tenderness present.  ?   Comments: Diffuse calf swelling and tenderness. Increased pain with dorsi-flexion of right foot. Slight erythema over shin, but is quite mild. No bony tenderness. ?TTP popliteal area.  ?Skin: ?   General: Skin is warm and dry.  ?Neurological:  ?   Mental Status: She  is alert.  ? ? ?ED Results / Procedures / Treatments   ?Labs ?(all labs ordered are listed, but only abnormal results are displayed) ?Labs Reviewed  ?COMPREHENSIVE METABOLIC PANEL - Abnormal; Notable for the following components:  ?    Result Value  ? Potassium 3.1 (*)   ? Calcium 8.5 (*)   ? AST 70 (*)   ? ALT 117 (*)   ? Alkaline Phosphatase 227 (*)   ? All other components within normal limits  ?CBC WITH DIFFERENTIAL/PLATELET  ? ? ?EKG ?None ? ?Radiology ?DG Tibia/Fibula Right ? ?Result Date: 05/01/2021 ?CLINICAL DATA:  Right calf pain. EXAM: RIGHT TIBIA AND FIBULA - 2 VIEW COMPARISON:  None. FINDINGS: There is no evidence of fracture or other focal bone lesions. There are degenerative changes of the patellofemoral joint. Soft  tissues are unremarkable. IMPRESSION: 1. No acute abnormality. 2. Degenerative changes of the patellofemoral joint. Electronically Signed   By: Darliss Cheney M.D.   On: 05/01/2021 20:08   ? ?Procedures ?Procedures  ? ? ?Medications Ordered in ED ?Medications  ?oxyCODONE (Oxy IR/ROXICODONE) immediate release tablet 5 mg (5 mg Oral Given 05/01/21 1931)  ?apixaban (ELIQUIS) tablet 10 mg (10 mg Oral Given 05/01/21 2021)  ?potassium chloride SA (KLOR-CON M) CR tablet 40 mEq (40 mEq Oral Given 05/01/21 2020)  ? ? ?ED Course/ Medical Decision Making/ A&P ?  ?                        ?Medical Decision Making ?Amount and/or Complexity of Data Reviewed ?Labs: ordered. ?Radiology: ordered. ? ?Risk ?Prescription drug management. ? ? ?The worst of her pain seems to be popliteal.  Unfortunately due to the time of when she has arrived and been roomed, we do not have ultrasound for DVT available.  X-ray was obtained given the degree of pain and I personally reviewed these images and there is no obvious gas, fractures, or knee issues.  This does not seem to be a bony problem is much as a soft tissue.  She is neurovascular intact.  After oxycodone she is feeling a little better and trying to get up to stand and walk but was limping severely.  However, she still wants to go home.  Offered to do a CT to see if there is any other obvious finding or to see if we can see a blood clot but she declines and just wants to do the ultrasound tomorrow.  She was given a dose of Eliquis in case this is blood clot to start treatment now.  Labs were obtained and her WBC is normal.  There is very minimal redness so I think cellulitis is unlikely.  Patient was advised of return precautions.  She has no signs/symptoms of PE.  Could be a Baker's cyst that has ruptured but without the ultrasound is hard to tell.  We will send a short course of oxycodone to her pharmacy.  Discussed no NSAIDs for 12 hours due to the Eliquis. ? ? ? ? ? ? ? ?Final Clinical  Impression(s) / ED Diagnoses ?Final diagnoses:  ?Right leg swelling  ? ? ?Rx / DC Orders ?ED Discharge Orders   ? ?      Ordered  ?  LE VENOUS       ? 05/01/21 2112  ?  oxyCODONE (ROXICODONE) 5 MG immediate release tablet  Every 4 hours PRN       ? 05/01/21 2113  ? ?  ?  ? ?  ? ? ?  ?  Pricilla Loveless, MD ?05/01/21 2120 ? ?

## 2021-05-01 NOTE — Discharge Instructions (Addendum)
Return to the ER if you develop fever, new or worsening pain, chest pain, shortness of breath, or any other new/concerning symptoms. ? ?IMPORTANT PATIENT INSTRUCTIONS:  You have been scheduled for an Outpatient Vascular Study at [facility]   ? ?If tomorrow is a Saturday, Sunday or holiday, please go to the [facility] Emergency Department Registration Desk at 11 am tomorrow morning and tell them you are there for a vascular study.   ?If tomorrow is a weekday (Monday-Friday), please go to [facility] Entrance [facility] Clinic Registration at 11 am and tell them you are there for a vascular study. ?

## 2021-05-01 NOTE — ED Triage Notes (Signed)
Patient reports right LE leg pain, redness and swelling for the past few days, had an order for a Doppler US, but the imaging center was closed.  ?

## 2021-05-02 ENCOUNTER — Observation Stay (HOSPITAL_BASED_OUTPATIENT_CLINIC_OR_DEPARTMENT_OTHER): Payer: Commercial Managed Care - PPO

## 2021-05-02 ENCOUNTER — Other Ambulatory Visit: Payer: Self-pay

## 2021-05-02 ENCOUNTER — Ambulatory Visit (HOSPITAL_BASED_OUTPATIENT_CLINIC_OR_DEPARTMENT_OTHER)
Admission: RE | Admit: 2021-05-02 | Discharge: 2021-05-02 | Disposition: A | Discharge status: Home / Self Care | Payer: Commercial Managed Care - PPO | Source: Ambulatory Visit | Attending: Emergency Medicine | Admitting: Emergency Medicine

## 2021-05-02 ENCOUNTER — Emergency Department (HOSPITAL_COMMUNITY): Payer: Commercial Managed Care - PPO

## 2021-05-02 ENCOUNTER — Observation Stay (HOSPITAL_COMMUNITY): Payer: Commercial Managed Care - PPO

## 2021-05-02 ENCOUNTER — Observation Stay (HOSPITAL_COMMUNITY)
Admission: EM | Admit: 2021-05-02 | Discharge: 2021-05-03 | Disposition: A | Discharge status: Home / Self Care | Payer: Commercial Managed Care - PPO | Attending: Internal Medicine | Admitting: Internal Medicine

## 2021-05-02 DIAGNOSIS — R0602 Shortness of breath: Secondary | ICD-10-CM | POA: Diagnosis present

## 2021-05-02 DIAGNOSIS — I10 Essential (primary) hypertension: Secondary | ICD-10-CM | POA: Diagnosis not present

## 2021-05-02 DIAGNOSIS — Z87891 Personal history of nicotine dependence: Secondary | ICD-10-CM | POA: Diagnosis not present

## 2021-05-02 DIAGNOSIS — I2699 Other pulmonary embolism without acute cor pulmonale: Principal | ICD-10-CM | POA: Diagnosis present

## 2021-05-02 DIAGNOSIS — Z91040 Latex allergy status: Secondary | ICD-10-CM | POA: Diagnosis not present

## 2021-05-02 DIAGNOSIS — Z20822 Contact with and (suspected) exposure to covid-19: Secondary | ICD-10-CM | POA: Insufficient documentation

## 2021-05-02 DIAGNOSIS — R52 Pain, unspecified: Secondary | ICD-10-CM | POA: Insufficient documentation

## 2021-05-02 DIAGNOSIS — R739 Hyperglycemia, unspecified: Secondary | ICD-10-CM | POA: Diagnosis not present

## 2021-05-02 DIAGNOSIS — I82401 Acute embolism and thrombosis of unspecified deep veins of right lower extremity: Secondary | ICD-10-CM | POA: Diagnosis not present

## 2021-05-02 DIAGNOSIS — R030 Elevated blood-pressure reading, without diagnosis of hypertension: Secondary | ICD-10-CM | POA: Diagnosis present

## 2021-05-02 DIAGNOSIS — I82441 Acute embolism and thrombosis of right tibial vein: Secondary | ICD-10-CM | POA: Insufficient documentation

## 2021-05-02 DIAGNOSIS — J45909 Unspecified asthma, uncomplicated: Secondary | ICD-10-CM | POA: Diagnosis not present

## 2021-05-02 DIAGNOSIS — M79604 Pain in right leg: Secondary | ICD-10-CM

## 2021-05-02 DIAGNOSIS — D649 Anemia, unspecified: Secondary | ICD-10-CM | POA: Diagnosis present

## 2021-05-02 DIAGNOSIS — Z91010 Allergy to peanuts: Secondary | ICD-10-CM | POA: Diagnosis not present

## 2021-05-02 DIAGNOSIS — I2609 Other pulmonary embolism with acute cor pulmonale: Secondary | ICD-10-CM

## 2021-05-02 DIAGNOSIS — I82491 Acute embolism and thrombosis of other specified deep vein of right lower extremity: Secondary | ICD-10-CM | POA: Insufficient documentation

## 2021-05-02 DIAGNOSIS — I82411 Acute embolism and thrombosis of right femoral vein: Secondary | ICD-10-CM | POA: Insufficient documentation

## 2021-05-02 DIAGNOSIS — I82431 Acute embolism and thrombosis of right popliteal vein: Secondary | ICD-10-CM | POA: Diagnosis not present

## 2021-05-02 LAB — RESP PANEL BY RT-PCR (FLU A&B, COVID) ARPGX2
Influenza A by PCR: NEGATIVE
Influenza B by PCR: NEGATIVE
SARS Coronavirus 2 by RT PCR: NEGATIVE

## 2021-05-02 LAB — TROPONIN I (HIGH SENSITIVITY)
Troponin I (High Sensitivity): 7 ng/L (ref <18)
Troponin I (High Sensitivity): 5 ng/L (ref <18)

## 2021-05-02 LAB — ECHOCARDIOGRAM COMPLETE
AR max vel: 2.32 cm2
AV Area VTI: 2.13 cm2
AV Area mean vel: 2.24 cm2
AV Mean grad: 5 mmHg
AV Peak grad: 9.4 mmHg
Ao pk vel: 1.53 m/s
Area-P 1/2: 3.6 cm2
Height: 68 in
MV VTI: 2.26 cm2
S' Lateral: 3.4 cm
Weight: 2968 oz

## 2021-05-02 LAB — CBC WITH DIFFERENTIAL/PLATELET
Abs Immature Granulocytes: 0.02 10*3/uL (ref 0.00–0.07)
Basophils Absolute: 0 10*3/uL (ref 0.0–0.1)
Basophils Relative: 0 %
Eosinophils Absolute: 0.1 10*3/uL (ref 0.0–0.5)
Eosinophils Relative: 2 %
HCT: 33 % — ABNORMAL LOW (ref 36.0–46.0)
Hemoglobin: 10.7 g/dL — ABNORMAL LOW (ref 12.0–15.0)
Immature Granulocytes: 0 %
Lymphocytes Relative: 24 %
Lymphs Abs: 1.5 10*3/uL (ref 0.7–4.0)
MCH: 30.9 pg (ref 26.0–34.0)
MCHC: 32.4 g/dL (ref 30.0–36.0)
MCV: 95.4 fL (ref 80.0–100.0)
Monocytes Absolute: 0.6 10*3/uL (ref 0.1–1.0)
Monocytes Relative: 10 %
Neutro Abs: 3.9 10*3/uL (ref 1.7–7.7)
Neutrophils Relative %: 64 %
Platelets: 237 10*3/uL (ref 150–400)
RBC: 3.46 MIL/uL — ABNORMAL LOW (ref 3.87–5.11)
RDW: 12.6 % (ref 11.5–15.5)
WBC: 6.2 10*3/uL (ref 4.0–10.5)
nRBC: 0 % (ref 0.0–0.2)

## 2021-05-02 LAB — COMPREHENSIVE METABOLIC PANEL
ALT: 113 U/L — ABNORMAL HIGH (ref 0–44)
AST: 81 U/L — ABNORMAL HIGH (ref 15–41)
Albumin: 3.1 g/dL — ABNORMAL LOW (ref 3.5–5.0)
Alkaline Phosphatase: 201 U/L — ABNORMAL HIGH (ref 38–126)
Anion gap: 9 (ref 5–15)
BUN: 10 mg/dL (ref 8–23)
CO2: 23 mmol/L (ref 22–32)
Calcium: 8.3 mg/dL — ABNORMAL LOW (ref 8.9–10.3)
Chloride: 108 mmol/L (ref 98–111)
Creatinine, Ser: 0.83 mg/dL (ref 0.44–1.00)
GFR, Estimated: >60 mL/min (ref >60)
Glucose, Bld: 105 mg/dL — ABNORMAL HIGH (ref 70–99)
Potassium: 4.2 mmol/L (ref 3.5–5.1)
Sodium: 140 mmol/L (ref 135–145)
Total Bilirubin: 0.6 mg/dL (ref 0.3–1.2)
Total Protein: 6.4 g/dL — ABNORMAL LOW (ref 6.5–8.1)

## 2021-05-02 LAB — PROTIME-INR
INR: 1.2 (ref 0.8–1.2)
INR: 1.4 — ABNORMAL HIGH (ref 0.8–1.2)
Prothrombin Time: 14.8 seconds (ref 11.4–15.2)
Prothrombin Time: 17.1 seconds — ABNORMAL HIGH (ref 11.4–15.2)

## 2021-05-02 LAB — LACTIC ACID, PLASMA: Lactic Acid, Venous: 0.8 mmol/L (ref 0.5–1.9)

## 2021-05-02 LAB — BRAIN NATRIURETIC PEPTIDE: B Natriuretic Peptide: 42.8 pg/mL (ref 0.0–100.0)

## 2021-05-02 MED ORDER — FENTANYL CITRATE PF 50 MCG/ML IJ SOSY
25.0000 ug | PREFILLED_SYRINGE | Freq: Once | INTRAMUSCULAR | Status: AC
  Administered 2021-05-02: 25 ug via INTRAVENOUS
  Filled 2021-05-02: qty 1

## 2021-05-02 MED ORDER — HEPARIN (PORCINE) 25000 UT/250ML-% IV SOLN
1400.0000 [IU]/h | INTRAVENOUS | Status: DC
  Filled 2021-05-02: qty 250

## 2021-05-02 MED ORDER — APIXABAN 5 MG PO TABS
10.0000 mg | ORAL_TABLET | Freq: Two times a day (BID) | ORAL | Status: DC
Start: 2021-05-02 — End: 2021-05-03
  Administered 2021-05-02 – 2021-05-03 (×2): 10 mg via ORAL
  Filled 2021-05-02 (×2): qty 2

## 2021-05-02 MED ORDER — APIXABAN 5 MG PO TABS
10.0000 mg | ORAL_TABLET | Freq: Once | ORAL | Status: AC
  Administered 2021-05-02: 10 mg via ORAL
  Filled 2021-05-02: qty 2

## 2021-05-02 MED ORDER — KETOROLAC TROMETHAMINE 30 MG/ML IJ SOLN
30.0000 mg | Freq: Once | INTRAMUSCULAR | Status: AC
  Administered 2021-05-02: 30 mg via INTRAVENOUS
  Filled 2021-05-02 (×2): qty 1

## 2021-05-02 MED ORDER — IOHEXOL 350 MG/ML SOLN
50.0000 mL | Freq: Once | INTRAVENOUS | Status: AC | PRN
  Administered 2021-05-02: 50 mL via INTRAVENOUS

## 2021-05-02 MED ORDER — KETOROLAC TROMETHAMINE 60 MG/2ML IM SOLN
60.0000 mg | Freq: Once | INTRAMUSCULAR | Status: DC
  Filled 2021-05-02: qty 2

## 2021-05-02 MED ORDER — FAMOTIDINE 20 MG PO TABS
20.0000 mg | ORAL_TABLET | Freq: Every day | ORAL | Status: DC
  Administered 2021-05-02 – 2021-05-03 (×2): 20 mg via ORAL
  Filled 2021-05-02 (×2): qty 1

## 2021-05-02 MED ORDER — APIXABAN 5 MG PO TABS: 5.0000 mg | ORAL_TABLET | Freq: Two times a day (BID) | ORAL | Status: DC

## 2021-05-02 NOTE — Progress Notes (Signed)
?  Echocardiogram ?2D Echocardiogram has been performed. ? ?Deborah Pratt ?05/02/2021, 4:49 PM ?

## 2021-05-02 NOTE — Plan of Care (Signed)
  Problem: Pain Managment: Goal: General experience of comfort will improve Outcome: Progressing   Problem: Pain Managment: Goal: General experience of comfort will improve Outcome: Progressing   

## 2021-05-02 NOTE — Progress Notes (Signed)
?   05/02/21 1516  ?Assess: MEWS Score  ?Temp 98.1 ?F (36.7 ?C)  ?BP (!) 160/94  ?Pulse Rate 97  ?ECG Heart Rate 96  ?Level of Consciousness Alert  ?SpO2 96 %  ?Assess: MEWS Score  ?MEWS Temp 0  ?MEWS Systolic 0  ?MEWS Pulse 0  ?MEWS RR 2  ?MEWS LOC 0  ?MEWS Score 2  ?MEWS Score Color Yellow  ?Assess: if the MEWS score is Yellow or Red  ?Were vital signs taken at a resting state? Yes  ?Focused Assessment No change from prior assessment  ?Early Detection of Sepsis Score *See Row Information* Low  ?MEWS guidelines implemented *See Row Information* Yes  ?Treat  ?MEWS Interventions Escalated (See documentation below)  ?Pain Scale 0-10  ?Pain Score 7  ?Pain Type Acute pain  ?Pain Location Leg  ?Pain Orientation Right  ?Pain Descriptors / Indicators Aching;Cramping  ?Take Vital Signs  ?Increase Vital Sign Frequency  Yellow: Q 2hr X 2 then Q 4hr X 2, if remains yellow, continue Q 4hrs  ?Escalate  ?MEWS: Escalate Yellow: discuss with charge nurse/RN and consider discussing with provider and RRT  ?Notify: Charge Nurse/RN  ?Name of Charge Nurse/RN Notified Yoko, RN  ?Date Charge Nurse/RN Notified 05/02/21  ?Time Charge Nurse/RN Notified 1516  ?Notify: Provider  ?Provider Name/Title Geryl Councilman, MD  ?Date Provider Notified 05/02/21  ?Time Provider Notified [phone]  ?Notification Type Face-to-face  ?Notification Reason Other (Comment) ?(high BP)  ?Provider response At bedside  ?Date of Provider Response 05/02/21  ?Time of Provider Response 1516  ? ? ?

## 2021-05-02 NOTE — ED Provider Notes (Signed)
?[facility] EMERGENCY DEPARTMENT ?Provider Note ? ? ?CSN: [id number] ?Arrival date & time: 05/02/21  0831 ? ?  ? ?History ? ?No chief complaint on file. ? ? ?Deborah Pratt is a 66 y.o. female with past medical history of hypertension, asthma, remote history of PE.  Presents to the emergency department with a chief complaint of DVT to right lower extremity.  Patient reports that she has been having pain to right popliteal area and right calf over the last 6 to 7 days.  She noticed swelling to her extremity yesterday.  Endorsed increased pain leading to difficulty with ambulation.  Patient was seen in the emergency department yesterday and given dose of Eliquis.  Patient had ultrasound imaging today which showed DVT.  Patient is now endorsing shortness of breath.  Reports that shortness of breath started this morning and has been constant.  Husband at bedside reports that patient has had increased breathing rate over the last 1 to 2 months.  She denies any chest pain, hemoptysis, palpitations, lightheadedness, syncope. ? ?HPI ? ?  ? ?Home Medications ?Prior to Admission medications   ?Medication Sig Start Date End Date Taking? Authorizing Provider  ?diphenhydrAMINE (BENADRYL) 25 MG tablet Take 1 tablet (25 mg total) by mouth every 6 (six) hours. ?Patient not taking: Reported on 02/12/2021 01/07/17   Emi HolesLaw, Alexandra M, PA-C  ?famotidine (PEPCID) 20 MG tablet Take 1 tablet (20 mg total) by mouth 2 (two) times daily. ?Patient not taking: Reported on 02/12/2021 01/07/17   Emi HolesLaw, Alexandra M, PA-C  ?methocarbamol (ROBAXIN) 500 MG tablet Take 1 tablet (500 mg total) by mouth every 6 (six) hours as needed for muscle spasms. 02/15/21   Griselda Mineroth, Paul III, MD  ?oxyCODONE (ROXICODONE) 5 MG immediate release tablet Take 1 tablet (5 mg total) by mouth every 4 (four) hours as needed for severe pain. 05/01/21   Pricilla LovelessGoldston, Scott, MD  ?predniSONE (DELTASONE) 50 MG tablet Take 1 tablet (50 mg total) by mouth daily  with breakfast. ?Patient not taking: Reported on 02/12/2021 01/07/17   Emi HolesLaw, Alexandra M, PA-C  ?   ? ?Allergies    ?Other, Peanut-containing drug products, Shellfish allergy, Acetaminophen, Demerol [meperidine], Fish oil, Latex, Morphine, Pork-derived products, and Tomato   ? ?Review of Systems   ?Review of Systems  ?Constitutional:  Negative for chills and fever.  ?Eyes:  Negative for visual disturbance.  ?Respiratory:  Positive for shortness of breath.   ?Cardiovascular:  Positive for leg swelling. Negative for chest pain and palpitations.  ?Gastrointestinal:  Negative for abdominal pain, nausea and vomiting.  ?Musculoskeletal:  Negative for back pain and neck pain.  ?Skin:  Negative for color change, pallor, rash and wound.  ?Neurological:  Negative for dizziness, syncope, light-headedness and headaches.  ?Psychiatric/Behavioral:  Negative for confusion.   ? ?Physical Exam ?Updated Vital Signs ?BP 126/74 (BP Location: Left Arm)   Pulse 89   Temp 98.2 ?F (36.8 ?C) (Oral)   Resp (!) 21   Ht 5\' 8"  (1.727 m)   Wt 83 kg   SpO2 100%   BMI 27.82 kg/m?  ?Physical Exam ?Vitals and nursing note reviewed.  ?Constitutional:   ?   General: She is not in acute distress. ?   Appearance: She is not ill-appearing, toxic-appearing or diaphoretic.  ?HENT:  ?   Head: Normocephalic.  ?Eyes:  ?   General: No scleral icterus.    ?   Right eye: No discharge.     ?   Left eye:  No discharge.  ?Cardiovascular:  ?   Rate and Rhythm: Normal rate.  ?   Pulses:     ?     Radial pulses are 2+ on the right side and 2+ on the left side.  ?     Dorsalis pedis pulses are 2+ on the right side and 2+ on the left side.  ?   Heart sounds: Normal heart sounds, S1 normal and S2 normal. Heart sounds not distant. No murmur heard. ?Pulmonary:  ?   Effort: Pulmonary effort is normal. No tachypnea, bradypnea or respiratory distress.  ?   Breath sounds: Normal breath sounds. No stridor.  ?Musculoskeletal:  ?   Right knee: No swelling, deformity,  effusion, erythema, ecchymosis, lacerations, bony tenderness or crepitus. Normal range of motion. No tenderness. Normal alignment.  ?   Left knee: No swelling, deformity, effusion, erythema, ecchymosis, lacerations, bony tenderness or crepitus. Normal range of motion. No tenderness. Normal alignment.  ?   Right lower leg: Swelling and tenderness present. No deformity, lacerations or bony tenderness.  ?   Left lower leg: Normal.  ?   Right ankle: No swelling, deformity, ecchymosis or lacerations. No tenderness. Normal range of motion.  ?   Left ankle: No swelling, deformity, ecchymosis or lacerations. No tenderness. Normal range of motion.  ?   Right foot: Normal range of motion and normal capillary refill. No swelling, deformity, laceration, tenderness, bony tenderness or crepitus. Normal pulse.  ?   Left foot: Normal range of motion and normal capillary refill. No swelling, deformity, laceration, tenderness, bony tenderness or crepitus. Normal pulse.  ?Feet:  ?   Right foot:  ?   Skin integrity: Skin integrity normal.  ?   Toenail Condition: Right toenails are normal.  ?   Left foot:  ?   Skin integrity: Skin integrity normal.  ?   Toenail Condition: Left toenails are normal.  ?Skin: ?   General: Skin is warm and dry.  ?Neurological:  ?   General: No focal deficit present.  ?   Mental Status: She is alert.  ?Psychiatric:     ?   Behavior: Behavior is cooperative.  ? ? ?ED Results / Procedures / Treatments   ?Labs ?(all labs ordered are listed, but only abnormal results are displayed) ?Labs Reviewed  ?COMPREHENSIVE METABOLIC PANEL - Abnormal; Notable for the following components:  ?    Result Value  ? Glucose, Bld 105 (*)   ? Calcium 8.3 (*)   ? Total Protein 6.4 (*)   ? Albumin 3.1 (*)   ? AST 81 (*)   ? ALT 113 (*)   ? Alkaline Phosphatase 201 (*)   ? All other components within normal limits  ?CBC WITH DIFFERENTIAL/PLATELET - Abnormal; Notable for the following components:  ? RBC 3.46 (*)   ? Hemoglobin 10.7 (*)    ? HCT 33.0 (*)   ? All other components within normal limits  ?RESP PANEL BY RT-PCR (FLU A&B, COVID) ARPGX2  ?BRAIN NATRIURETIC PEPTIDE  ?PROTIME-INR  ?TROPONIN I (HIGH SENSITIVITY)  ? ? ?EKG ?None ? ?Radiology ?DG Chest 2 View ? ?Result Date: 05/02/2021 ?CLINICAL DATA:  Shortness of breath EXAM: CHEST - 2 VIEW COMPARISON:  02/12/2021 FINDINGS: The heart size and mediastinal contours are within normal limits. Both lungs are clear. The visualized skeletal structures are unremarkable. IMPRESSION: No active cardiopulmonary disease. Electronically Signed   By: Ernie Avena M.D.   On: 05/02/2021 09:14  ? ?DG Tibia/Fibula Right ? ?Result  Date: 05/01/2021 ?CLINICAL DATA:  Right calf pain. EXAM: RIGHT TIBIA AND FIBULA - 2 VIEW COMPARISON:  None. FINDINGS: There is no evidence of fracture or other focal bone lesions. There are degenerative changes of the patellofemoral joint. Soft tissues are unremarkable. IMPRESSION: 1. No acute abnormality. 2. Degenerative changes of the patellofemoral joint. Electronically Signed   By: Darliss Cheney M.D.   On: 05/01/2021 20:08  ? ?CT Angio Chest PE W and/or Wo Contrast ? ?Result Date: 05/02/2021 ?CLINICAL DATA:  Right leg pain with deep vein thrombosis. Remote history of pulmonary embolus. Shortness of breath. EXAM: CT ANGIOGRAPHY CHEST WITH CONTRAST TECHNIQUE: Multidetector CT imaging of the chest was performed using the standard protocol during bolus administration of intravenous contrast. Multiplanar CT image reconstructions and MIPs were obtained to evaluate the vascular anatomy. RADIATION DOSE REDUCTION: This exam was performed according to the departmental dose-optimization program which includes automated exposure control, adjustment of the mA and/or kV according to patient size and/or use of iterative reconstruction technique. CONTRAST:  50 cc Omnipaque 350 COMPARISON:  Chest radiograph 05/02/2021 FINDINGS: Cardiovascular: Pulmonary embolus is present with clot in the right  main pulmonary artery, right middle lobe pulmonary artery, right lower lobe pulmonary artery, and segmental and subsegmental branches of the right lower lobe pulmonary artery. There is also clot observed in the lin

## 2021-05-02 NOTE — H&P (Addendum)
? ?NAME:  Deborah Pratt, MRN:  [id number], DOB:  April 11, 1955, LOS: 0 ?ADMISSION DATE:  05/02/2021, Primary: Pcp, No  ?CHIEF COMPLAINT:  leg pain  ? ?Medical Service: Internal Medicine Teaching Service    ?     ?Attending Physician: Dr. Heber Stonefort, Rachel Moulds, DO    ?First Contact: Dr. Marlou Sa Pager: [phone]  ?Second Contact: Dr. Coy Saunas Pager: [phone]  ?     ?After Hours (After 5p/  First Contact Pager: [phone]  ?weekends / holidays): Second Contact Pager: [phone]  ? ? ?History of present illness   ?Deborah Pratt is a 66 year old female with remote hx of provoked PE (oral contraception) who recently underwent a lap chole in December 2022. She presented to the ED for progressive right lower extremity pain. ? ?Pain began shortly after her surgery in December and has slowly progressed. It became particularly bothersome over the past week, prompting her to seek further evaluation. She notes that she saw an allergist a couple of weeks ago who had noted her to be tachypneic. She was not experiencing shortness of breath at that time. While in the ED today however, she did start to experience some shortness of breath.  ? ?She notes that her prior PE was approximately 45 years ago and was attributed to having started oral contraception about 2 months prior.  ? ?Last colonoscopy was in 2008 and was reportedly normal. She can not recall a recent pap smear or mammogram although admits it was likely a long time ago.  ? ?No family history of coagulopathies.  ? ?Past Medical History  ?She,  has a past medical history of Allergy, ANXIETY (11/11/2006), Asthma, CHOLELITHIASIS (11/08/2006), DEPRESSION (11/11/2006), Generalized headaches, Hyperlipidemia (10/03/2014), HYPERTENSION (11/08/2006), Kidney stones, Pulmonary embolism ([place]) (66 yo ), and UTI (lower urinary tract infection).  ? ?Home Medications    ? ?Prior to Admission medications   ?Medication Sig Start Date End Date Taking? Authorizing Provider  ?diphenhydrAMINE  (BENADRYL) 25 MG tablet Take 1 tablet (25 mg total) by mouth every 6 (six) hours. ?Patient not taking: Reported on 02/12/2021 01/07/17   Frederica Kuster, PA-C  ?famotidine (PEPCID) 20 MG tablet Take 1 tablet (20 mg total) by mouth 2 (two) times daily. ?Patient not taking: Reported on 02/12/2021 01/07/17   Frederica Kuster, PA-C  ?methocarbamol (ROBAXIN) 500 MG tablet Take 1 tablet (500 mg total) by mouth every 6 (six) hours as needed for muscle spasms. 02/15/21   Jovita Kussmaul, MD  ?oxyCODONE (ROXICODONE) 5 MG immediate release tablet Take 1 tablet (5 mg total) by mouth every 4 (four) hours as needed for severe pain. 05/01/21   Sherwood Gambler, MD  ?predniSONE (DELTASONE) 50 MG tablet Take 1 tablet (50 mg total) by mouth daily with breakfast. ?Patient not taking: Reported on 02/12/2021 01/07/17   Frederica Kuster, PA-C  ? ? ?Allergies   ? ?Allergies as of 05/02/2021 - Review Complete 05/02/2021  ?Allergen Reaction Noted  ? Other Anaphylaxis 04/29/2015  ? Peanut-containing drug products Hives 04/29/2015  ? Shellfish allergy Hives 04/29/2015  ? Acetaminophen Rash   ? Demerol [meperidine] Hives 04/29/2015  ? Fish oil Hives 04/29/2015  ? Latex Rash   ? Morphine Rash   ? Pork-derived products Hives 04/29/2015  ? Tomato Hives 04/29/2015  ? ? ?Social History  ? reports that she quit smoking about 7 years ago. Her smoking use included cigarettes. She has never used smokeless tobacco. She reports current alcohol use. She reports that she does not  use drugs.  ? ?Family History   ?Her family history includes Arthritis in some other family members; Gout in her father; Heart disease in her mother and another family member; Hypertension in an other family member; Stroke in an other family member.  ? ?ROS  ?10 point review of systems negative unless stated in the HPI. ? ?Objective   ?Blood pressure (!) 149/100, pulse 84, temperature 98.2 ?F (36.8 ?C), temperature source Oral, resp. rate (!) 34, height 5\' 8"  (1.727 m), weight 83  kg, SpO2 96 %. ?   ?General: well appearing female in no distress ?Cardiac: RRR, no LE edema. Palpable firmness on the posterior aspect of the right knee. No RUE edema ?Pulm: breathing comfortably on room air. Able to speak in full sentences. No accessory muscle use. Lung sounds are clear throughout.  ?GI: soft, non-distended ?Skin: warm and dry ?MSK: normal bulk and tone. ?Neuro: a/o x4 ?Significant Diagnostic Tests:  ?DG Chest 2 View ?No active cardiopulmonary disease.  ? ?CT Angio Chest PE W and/or Wo Contrast ?Acute bilateral pulmonary embolus with moderate clot burden. Positive for acute PE with CT evidence of right heart strain (RV/LV Ratio = 0.99) consistent with at least submassive (intermediate risk) PE.  ?Mild atelectasis in the right lower lobe.  ?Small anterior pericardial effusion. Mild cardiomegaly. Minimal aortic arch atherosclerotic calcification. ? ?LE VENOUS DUPLEX ?RIGHT: - Findings consistent with age indeterminate deep vein thrombosis involving the right popliteal vein, right posterior tibial veins, right gastrocnemius veins, and right femoral vein. - No cystic structure found in the popliteal fossa.  LEFT: - No evidence of common femoral vein obstruction.  *See table(s) above for measurements and observations.    Preliminary     ? ?Labs   ? ?CBC Latest Ref Rng & Units 05/02/2021 05/01/2021 02/15/2021  ?WBC 4.0 - 10.5 K/uL 6.2 5.5 7.1  ?Hemoglobin 12.0 - 15.0 g/dL 10.7(L) 12.2 10.3(L)  ?Hematocrit 36.0 - 46.0 % 33.0(L) 37.1 31.8(L)  ?Platelets 150 - 400 K/uL 237 227 160  ? ?BMP Latest Ref Rng & Units 05/02/2021 05/01/2021 02/15/2021  ?Glucose 70 - 99 mg/dL 105(H) 87 102(H)  ?BUN 8 - 23 mg/dL 10 13 11   ?Creatinine 0.44 - 1.00 mg/dL 0.83 0.80 0.77  ?Sodium 135 - 145 mmol/L 140 139 141  ?Potassium 3.5 - 5.1 mmol/L 4.2 3.1(L) 4.3  ?Chloride 98 - 111 mmol/L 108 107 111  ?CO2 22 - 32 mmol/L 23 23 23   ?Calcium 8.9 - 10.3 mg/dL 8.3(L) 8.5(L) 8.2(L)  ? ? ?Summary  ?47 yof with remote hx of provoked PE who  recently underwent lap chole 01/2021 admitted for monitoring of PE/right DVT. ? ?Assessment & Plan:  ?Principal Problem: ?  Pulmonary embolism ([place]) ? ?Submassive PE (PESI class II), right lower extremity DVT  ?RV/LV ratio borderline on CTA but hemodynamically stable--no indication for advanced therapies at this time.  ?Would consider this provoked in the setting of recent surgery ?Admit to tele medical ?Check echocardiogram ?Check troponins, BNP ?Since PESI class stratifies her as low risk and she has several allergies which are supposedly delaying initiation of anticoagulation with heparin, I think it would be reasonable to initiate DOAC therapy. Will start her on eliquis VTE starter pack ?Tele monitoring ?Suspect she should be able to discharge in 1-2 days barring any complications ?She has an appointment arranged for April to establish with an outside provider.  ?Recommend age appropriate cancer screening after discharge.  ? ?Elevated blood pressure readings ?She notes that baseline blood pressures  are in the 0000000 systolically. Blood pressure is elevated at this time however will hold off on pharmacotherapy due to acute PE.  ? ?Hyperglycemia--monitor ? ?Elevated liver enzymes--likely due to cholecystectomy. Can be followed up on an outpatient basis ? ?Normocytic anemia. Stable since December. No signs of overt blood loss. Check iron studies in AM.  ? ?Chronic right upper arm pain. CT demonstrates shoulder arthropathy. No RUE edema, radial pulse intact. Most likely arthritis although VTE is an alternative consideration. VTE would not change management as she will require treatment with anticoagulation regardless, for PE/lower extremity DVT, so will defer additional workup for now.  ? ?Best practice:  ?CODE STATUS: FULL ?DVT for prophylaxis: DOAC ?Dispo: Admit patient to Observation with expected length of stay less than 2 midnights. ? ? ?Mitzi Hansen, MD ?Internal Medicine Resident PGY-3 ?[facility] ?Pager: [phone] ?05/02/2021 2:30 PM  ?  ? ? ? ?

## 2021-05-02 NOTE — Progress Notes (Signed)
2D echo attempted, but RN asked to hold off on echo until patient transfers to 6E. ?

## 2021-05-02 NOTE — ED Triage Notes (Addendum)
Pt here from home with complaints of right lower leg pain. Had a doppler done this AM and she does have a DVT in that leg. Pt was has been complaining of leg pain for about a week which worsened in intensity on Wednesday, pt stated she could barely walk on the leg. Pt does have swelling to the R leg. Pt does have a hx of a PE 45 years ago. Last surgery was a few months a go-gallbladder removal. Pt complaining of SHOB when coming into the room and getting undressed.  ?

## 2021-05-02 NOTE — Progress Notes (Incomplete)
ANTICOAGULATION CONSULT NOTE - Initial Consult ? ?Pharmacy Consult for Heparin infusion ?Indication: pulmonary embolus and DVT ? ?Allergies  ?Allergen Reactions  ? Other Anaphylaxis  ?  *Potatoes*   ? Peanut-Containing Drug Products Hives  ? Shellfish Allergy Hives  ? Acetaminophen Rash  ? Demerol [Meperidine] Hives  ? Fish Oil Hives  ? Latex Rash  ? Morphine Rash  ? Pork-Derived Products Hives  ? Tomato Hives  ? ? ?Patient Measurements: ?Height: 5\' 8"  (172.7 cm) ?Weight: 83 kg (182 lb 15.7 oz) ?IBW/kg (Calculated) : 63.9 ?Heparin Dosing Weight: 80.8 kg ? ?Vital Signs: ?Temp: 98.2 ?F (36.8 ?C) (03/17 05-11-1981) ?Temp Source: Oral (03/17 0855) ?BP: 147/81 (03/17 1215) ?Pulse Rate: 89 (03/17 1215) ? ?Labs: ?Recent Labs  ?  05/01/21 ?1925 05/02/21 ?05/04/21  ?HGB 12.2 10.7*  ?HCT 37.1 33.0*  ?PLT 227 237  ?LABPROT  --  14.8  ?INR  --  1.2  ?CREATININE 0.80 0.83  ? ? ?Estimated Creatinine Clearance: 75.3 mL/min (by C-G formula based on SCr of 0.83 mg/dL). ? ? ?Medical History: ?Past Medical History:  ?Diagnosis Date  ? Allergy   ? ANXIETY 11/11/2006  ? Qualifier: Diagnosis of  By: 11/13/2006 MD, Jonny Ruiz   ? Asthma   ? CHOLELITHIASIS 11/08/2006  ? Qualifier: Diagnosis of  By: 11/10/2006   ? DEPRESSION 11/11/2006  ? Qualifier: Diagnosis of  By: 11/13/2006 MD, Jonny Ruiz   ? Generalized headaches   ? Hyperlipidemia 10/03/2014  ? HYPERTENSION 11/08/2006  ? Qualifier: Diagnosis of  By: 11/10/2006   ? Kidney stones   ? Pulmonary embolism ([facility]) 66 yo   ? on BCP  ? UTI (lower urinary tract infection)   ? ? ?Assessment: ?*** ?Goal of Therapy:  ?Heparin level 0.3-0.7 units/ml ?aPTT 66-102 seconds ?Monitor platelets by anticoagulation protocol: Yes ?  ?Plan:  ?Check baseline aPTT and heparin level at 22:00 ?Start heparin infusion at 1400 units/hr at 2300 on 05/02/21 (>12 hours after last apixaban dose) ?Check heparin level and aPTT q6h until levels correlate and then transition to heparin levels  ?Monitor heparin level, aPTT, and CBC  daily ?Monitor for s/sx of bleeding ? ?05/04/21 ?05/02/2021,12:39 PM ? ?

## 2021-05-02 NOTE — Progress Notes (Signed)
Lower extremity venous has been completed.  ? ?Preliminary results in CV Proc.  ? ?Deborah Pratt Deborah Pratt ?05/02/2021 8:37 AM    ?

## 2021-05-02 NOTE — ED Notes (Signed)
Patient transported to X-ray 

## 2021-05-03 ENCOUNTER — Encounter (HOSPITAL_COMMUNITY): Payer: Self-pay | Admitting: Internal Medicine

## 2021-05-03 DIAGNOSIS — I824Y1 Acute embolism and thrombosis of unspecified deep veins of right proximal lower extremity: Secondary | ICD-10-CM

## 2021-05-03 DIAGNOSIS — I2699 Other pulmonary embolism without acute cor pulmonale: Secondary | ICD-10-CM | POA: Diagnosis not present

## 2021-05-03 LAB — IRON AND TIBC
Iron: 35 ug/dL (ref 28–170)
Saturation Ratios: 13 % (ref 10.4–31.8)
TIBC: 260 ug/dL (ref 250–450)
UIBC: 225 ug/dL

## 2021-05-03 LAB — BASIC METABOLIC PANEL
Anion gap: 10 (ref 5–15)
BUN: 6 mg/dL — ABNORMAL LOW (ref 8–23)
CO2: 23 mmol/L (ref 22–32)
Calcium: 8.5 mg/dL — ABNORMAL LOW (ref 8.9–10.3)
Chloride: 109 mmol/L (ref 98–111)
Creatinine, Ser: 0.84 mg/dL (ref 0.44–1.00)
GFR, Estimated: >60 mL/min (ref >60)
Glucose, Bld: 103 mg/dL — ABNORMAL HIGH (ref 70–99)
Potassium: 3.6 mmol/L (ref 3.5–5.1)
Sodium: 142 mmol/L (ref 135–145)

## 2021-05-03 LAB — CBC
HCT: 33.4 % — ABNORMAL LOW (ref 36.0–46.0)
Hemoglobin: 11 g/dL — ABNORMAL LOW (ref 12.0–15.0)
MCH: 30.6 pg (ref 26.0–34.0)
MCHC: 32.9 g/dL (ref 30.0–36.0)
MCV: 93 fL (ref 80.0–100.0)
Platelets: 241 10*3/uL (ref 150–400)
RBC: 3.59 MIL/uL — ABNORMAL LOW (ref 3.87–5.11)
RDW: 12.4 % (ref 11.5–15.5)
WBC: 4.7 10*3/uL (ref 4.0–10.5)
nRBC: 0 % (ref 0.0–0.2)

## 2021-05-03 LAB — PROTIME-INR
INR: 1.2 (ref 0.8–1.2)
Prothrombin Time: 15.6 seconds — ABNORMAL HIGH (ref 11.4–15.2)

## 2021-05-03 LAB — FERRITIN: Ferritin: 150 ng/mL (ref 11–307)

## 2021-05-03 MED ORDER — FAMOTIDINE 20 MG PO TABS: 20.0000 mg | ORAL_TABLET | Freq: Every day | ORAL | 2 refills | Status: DC

## 2021-05-03 MED ORDER — APIXABAN 5 MG PO TABS: ORAL_TABLET | ORAL | 2 refills | Status: DC

## 2021-05-03 MED ORDER — KETOROLAC TROMETHAMINE 15 MG/ML IJ SOLN
15.0000 mg | Freq: Once | INTRAMUSCULAR | Status: AC
  Administered 2021-05-03: 15 mg via INTRAVENOUS
  Filled 2021-05-03: qty 1

## 2021-05-03 NOTE — Discharge Instructions (Addendum)
Deborah Pratt,  ?It was a pleasure taking care of you at [facility]. You were admitted for leg pain and treated for blood clots that we found ?In your lungs. We are discharging you home now that you are doing better. You will likely need to be on a blood thinner for the next 6 months to 12 months. Please follow the following instructions.  ?1) Take Eliquis 10 mg twice daily until March 22nd, then starting March 23, take only 5 mg twice daily.  ?2) The staff from our internal medicine clinic will call you for hospital follow-up next week. ?3) Continue to stay active as tolerated but do not exert yourself too much ? ?Take care,  ?Dr. Sharrell Ku, MD, MPH ? ?

## 2021-05-03 NOTE — Hospital Course (Signed)
3/18 ?Pain in center of chest that occurs when she takes a deep breath - but it's also present right now at rest  ?Sharp pain, tightness  ?Doesn't radiate anywhere - not in back or arm ?No dizziness, blurry vision ?Does have a headache ?Leg is feeling much better today  ?

## 2021-05-03 NOTE — Discharge Summary (Signed)
? ?Name: Deborah Pratt ?MRN: [id number] ?DOB: [birth year] 66 y.o. ?PCP: Pcp, No ? ?Date of Admission: 05/02/2021  8:37 AM ?Date of Discharge: 05/03/2021 ?Attending Physician: Dr. Cain Sieve ? ?Discharge Diagnosis: ?Principal Problem: ?  Pulmonary embolism ([place]) ?Active Problems: ?  Normocytic anemia ?  Elevated blood pressure reading without diagnosis of hypertension ?  Acute deep vein thrombosis (DVT) of right lower extremity ([place]) ?  ? ?Discharge Medications: ?Allergies as of 05/03/2021   ? ?   Reactions  ? Other Anaphylaxis  ? *Potatoes*   ? Peanut-containing Drug Products Hives  ? Shellfish Allergy Hives  ? Acetaminophen Rash  ? Demerol [meperidine] Hives  ? Fish Oil Hives  ? Latex Rash  ? Morphine Rash  ? Pork-derived Products Hives  ? Tomato Hives  ? ?  ? ?  ?Medication List  ?  ? ?STOP taking these medications   ? ?ibuprofen 200 MG tablet ?Commonly known as: ADVIL ?  ? ?  ? ?TAKE these medications   ? ?apixaban 5 MG Tabs tablet ?Commonly known as: ELIQUIS ?Take 2 tablets (10 mg total) by mouth 2 (two) times daily for 5 days, THEN 1 tablet (5 mg total) 2 (two) times daily. ?Start taking on: May 03, 2021 ?  ?famotidine 20 MG tablet ?Commonly known as: PEPCID ?Take 1 tablet (20 mg total) by mouth daily. ?Start taking on: May 04, 2021 ?  ?oxyCODONE 5 MG immediate release tablet ?Commonly known as: Roxicodone ?Take 1 tablet (5 mg total) by mouth every 4 (four) hours as needed for severe pain. ?  ? ?  ? ? ?Disposition and follow-up:   ?Ms.Tori Milks Whitney-Taylor was discharged from [facility] in Stable condition.  At the hospital follow up visit please address: ? ?1.  Follow-up: ? a.  Acute Pulmonary Embolism/DVT: Patient hemodynamically stable during hospitalization. No oxygen requirement or chest pain. Her PE was likely provoked, however she will likely need at least 6 months of anticoagulation due to this being her second episode. Please assess for adherence to Eliquis.  ?  ? b.   Elevated blood pressure: Patient blood pressure was elevated to SBP in the 130s to 160s during hospitalization.  She will need her BP checked during her hospital follow-up and discussions about lifestyle changes versus medications. ? ?2.  Labs / imaging needed at time of follow-up: None ? ?3.  Pending labs/ test needing follow-up: None ? ?Follow-up Appointments: ? Follow-up Information   ? ? [facility]. Go in 1 week(s).   ?Why: The front desk staff will call you to schedule a hospital follow-up appointment. ?Contact information: ?[address] ?[place] ?[phone] ? ?  ?  ? ?  ?  ? ?  ? ? ?Hospital Course by problem list: ? ?#Pulmonary embolism/RLE DVT: ?Patient with a history of PE 45 years ago presented with right lower extremity pain and swelling and found to have right lower extremity DVT on ultrasound.  Patient also found to have some respiratory distress on admission which was evaluated with CTA that showed acute bilateral pulmonary embolism with moderate clot burden in the right heart strain (RV/LV ratio = 0.99).  An echocardiogram was obtained which showed EF 55%, G1 DD, normal RV and no valvular abnormalities. Remained hemodynamically stable throughout her hospitalization. She was started on Eliquis starter pack instead of heparin due to her low risk. Patient's PE was thought to be provoked in the setting of recent surgery 3 months  ago however due to this being patient's second PE episode, she will likely need a longer course of anticoagulation. This will be at the discretion of her PCP.  At discharge, patient was counseled on importance of taking her Eliquis consistently.  Patient was instructed to take Eliquis 10 mg twice daily until the 22nd and start Eliquis 5 mg twice daily the next day. Patient instructed to follow-up with internal medicine clinic for hospital follow-up. ? ?#Elevated blood pressure ?Patient was found to have elevated blood  pressures during her hospitalization in the setting of the acute pulmonary embolism.  SBP ranged from the 130s to 150s.  Patient was not started on any hypertensive medication in this acute setting.  She will need her BP recheck in the outpatient for better BP evaluation and possible intervention as needed. ? ?#Normocytic anemia ?Patient has had mild anemia since December.  She does not have overt blood loss.  Iron studies during hospitalization was unremarkable. Patient will likely need colonoscopy screening in the outpatient if not up to date. ? ?Subjective ?Patient was evaluated at the bedside and reported some pain in center of chest that occurs when she takes a deep breath and also present at rest. The pain is described as sharp/tightness and nonradiating.  She denies any blurry vision, dizziness.  She had a headache which improved with the Toradol.  Leg pain is better this morning. On reassessment, patient ready to go home with improvement in symptoms. ? ?Objective: ?Discharge Vitals:   ?BP (!) 150/82 (BP Location: Left Arm)   Pulse 83   Temp 98.8 ?F (37.1 ?C) (Oral)   Resp 13   Ht 5\' 8"  (1.727 m)   Wt 84.1 kg   SpO2 97%   BMI 28.21 kg/m?  ?General: Pleasant, well-appearing elderly woman laying in bed. No acute distress. ?CV: RRR. No murmurs, rubs, or gallops. No LE edema ?Pulmonary: On room air.  Lungs CTAB. Normal effort. No wheezing or rales. ?Abdominal: Soft, nontender, nondistended. Normal bowel sounds. ?Extremities: Radial and DP pulses 2+ and symmetric.  Mild tenderness to palpation of the right calf. No erythema. ?Skin: Warm and dry. No obvious rash or lesions. ?Neuro: A&Ox3. Moves all extremities. Normal sensation to gross touch. ?Psych: Normal mood and affect ? ? ?Pertinent Labs, Studies, and Procedures:  ?CBC Latest Ref Rng & Units 05/03/2021 05/02/2021 05/01/2021  ?WBC 4.0 - 10.5 K/uL 4.7 6.2 5.5  ?Hemoglobin 12.0 - 15.0 g/dL 11.0(L) 10.7(L) 12.2  ?Hematocrit 36.0 - 46.0 % 33.4(L) 33.0(L) 37.1   ?Platelets 150 - 400 K/uL 241 237 227  ? ? ?CMP Latest Ref Rng & Units 05/03/2021 05/02/2021 05/01/2021  ?Glucose 70 - 99 mg/dL 103(H) 105(H) 87  ?BUN 8 - 23 mg/dL 6(L) 10 13  ?Creatinine 0.44 - 1.00 mg/dL 0.84 0.83 0.80  ?Sodium 135 - 145 mmol/L 142 140 139  ?Potassium 3.5 - 5.1 mmol/L 3.6 4.2 3.1(L)  ?Chloride 98 - 111 mmol/L 109 108 107  ?CO2 22 - 32 mmol/L 23 23 23   ?Calcium 8.9 - 10.3 mg/dL 8.5(L) 8.3(L) 8.5(L)  ?Total Protein 6.5 - 8.1 g/dL - 6.4(L) 7.3  ?Total Bilirubin 0.3 - 1.2 mg/dL - 0.6 0.4  ?Alkaline Phos 38 - 126 U/L - 201(H) 227(H)  ?AST 15 - 41 U/L - 81(H) 70(H)  ?ALT 0 - 44 U/L - 113(H) 117(H)  ? ? ?DG Chest 2 View ? ?Result Date: 05/02/2021 ?CLINICAL DATA:  Shortness of breath EXAM: CHEST - 2 VIEW COMPARISON:  02/12/2021 FINDINGS: The heart  size and mediastinal contours are within normal limits. Both lungs are clear. The visualized skeletal structures are unremarkable. IMPRESSION: No active cardiopulmonary disease. Electronically Signed   By: Elmer Picker M.D.   On: 05/02/2021 09:14  ? ?CT Angio Chest PE W and/or Wo Contrast ? ?Result Date: 05/02/2021 ?CLINICAL DATA:  Right leg pain with deep vein thrombosis. Remote history of pulmonary embolus. Shortness of breath. EXAM: CT ANGIOGRAPHY CHEST WITH CONTRAST TECHNIQUE: Multidetector CT imaging of the chest was performed using the standard protocol during bolus administration of intravenous contrast. Multiplanar CT image reconstructions and MIPs were obtained to evaluate the vascular anatomy. RADIATION DOSE REDUCTION: This exam was performed according to the departmental dose-optimization program which includes automated exposure control, adjustment of the mA and/or kV according to patient size and/or use of iterative reconstruction technique. CONTRAST:  50 cc Omnipaque 350 COMPARISON:  Chest radiograph 05/02/2021 FINDINGS: Cardiovascular: Pulmonary embolus is present with clot in the right main pulmonary artery, right middle lobe pulmonary artery,  right lower lobe pulmonary artery, and segmental and subsegmental branches of the right lower lobe pulmonary artery. There is also clot observed in the lingular branch of the left upper lobe pulmonary artery and to a

## 2021-05-03 NOTE — Care Management (Signed)
[facility]'s nurse provided Eliquis discount pharmacy card to patient. Writer called [facility] to cross check Eliquis cost. Spoke with [place]. Cost of 30 day supply of Eliquis is 40.00. Made Mrs. Deborah Pratt aware.  ? ?No further TOC needs identified.  ? ? ?Raiford Noble, MSN, RN,BSN ?[facility] Case Manager ?[phone]   ?

## 2021-05-05 ENCOUNTER — Telehealth: Payer: Self-pay | Admitting: Internal Medicine

## 2021-05-05 NOTE — Telephone Encounter (Signed)
TOC NEW HFU APPT ? ?Name: Deborah Pratt, Deborah Pratt MRN: [id number]  ?Date: 05/08/2021 Status: Sch  ?Time: 9:15 AM Length: 60  ?Visit Type: OPEN ESTABLISHED [726] Copay: $0.00  ?Provider: Rudene Christians, DO    ? ?

## 2021-05-08 ENCOUNTER — Ambulatory Visit (INDEPENDENT_AMBULATORY_CARE_PROVIDER_SITE_OTHER): Payer: Commercial Managed Care - PPO | Admitting: Internal Medicine

## 2021-05-08 ENCOUNTER — Encounter: Payer: Self-pay | Admitting: Internal Medicine

## 2021-05-08 VITALS — BP 131/86 | HR 94 | Ht 68.0 in | Wt 185.9 lb

## 2021-05-08 DIAGNOSIS — Z1211 Encounter for screening for malignant neoplasm of colon: Secondary | ICD-10-CM | POA: Diagnosis not present

## 2021-05-08 DIAGNOSIS — Z1231 Encounter for screening mammogram for malignant neoplasm of breast: Secondary | ICD-10-CM

## 2021-05-08 DIAGNOSIS — R030 Elevated blood-pressure reading, without diagnosis of hypertension: Secondary | ICD-10-CM

## 2021-05-08 DIAGNOSIS — I824Y1 Acute embolism and thrombosis of unspecified deep veins of right proximal lower extremity: Secondary | ICD-10-CM

## 2021-05-08 DIAGNOSIS — I2699 Other pulmonary embolism without acute cor pulmonale: Secondary | ICD-10-CM

## 2021-05-08 DIAGNOSIS — D649 Anemia, unspecified: Secondary | ICD-10-CM | POA: Diagnosis not present

## 2021-05-08 MED ORDER — OXYCODONE HCL 5 MG PO TABS: 5.0000 mg | ORAL_TABLET | Freq: Three times a day (TID) | ORAL | 0 refills | Status: AC | PRN

## 2021-05-08 NOTE — Patient Instructions (Addendum)
Deborah Pratt, it was a pleasure seeing you today! ? ?Today we discussed: ?Blood clot in lung and leg ?Please continue taking Eliquis 5 mg in the morning and at night. You will need to take this for the next 6 months. To help with pain relief, I am sending in some additional pain medication. Also try using warm compresses. ? ?Screening for breast and colon cancer ?Please complete mammogram. I am referring you to GI for repeat colonoscopy for colon cancer screening. ? ?Low blood count ?I am checking your vitamin levels. Sometimes time can lead to low blood count. ? ?Blood pressure ?Please try to get a blood pressure cuff and take your blood pressure once daily around the same time. Write these numbers down and we can go over them when you come back in 4 weeks. ? ?I have ordered the following labs today: ? ?Lab Orders    ?     Folate    ?     Vitamin B12    ?     Reticulocytes Count    ?  ? ?I will call if any are abnormal. All of your labs can be accessed through "My Chart" ?  ?My Chart Access: ?[email or web address] ? ? ?Referrals ordered today:  ? ?Referral Orders    ?     Ambulatory referral to Gastroenterology    ?  ? ?I have ordered the following medication/changed the following medications:  ? ?Stop the following medications: ?Medications Discontinued During This Encounter  ?Medication Reason  ? oxyCODONE (ROXICODONE) 5 MG immediate release tablet Completed Course  ?  ? ?Start the following medications: ?Meds ordered this encounter  ?Medications  ? oxyCODONE (ROXICODONE) 5 MG immediate release tablet  ?  Sig: Take 1 tablet (5 mg total) by mouth every 8 (eight) hours as needed.  ?  Dispense:  10 tablet  ?  Refill:  0  ?  ? ?Follow-up:  4 weeks   ? ?Please make sure to arrive 15 minutes prior to your next appointment. If you arrive late, you may be asked to reschedule.  ? ?We look forward to seeing you next time. Please call our clinic at [phone] if you  have any questions or concerns. The best time to call is Monday-Friday from 9am-4pm, but there is someone available 24/7. If after hours or the weekend, call the main hospital number and ask for the Internal Medicine Resident On-Call. If you need medication refills, please notify your pharmacy one week in advance and they will send [name] a request. ? ?Thank you for letting us take part in your care. Wishing you the best! ? ?Thank you, ?Dr. Sloan Leiter ?[facility]  ?

## 2021-05-08 NOTE — Progress Notes (Addendum)
? ? ?Subjective:  ?CC: Hospital follow-up after DVT/PE, new patient ? ?HPI: ? ?[name] is a 66 y.o. female with a past medical history of prior provoked DVT/ PE who presents today to establish care and follow-up after admission for DVT and PE. Please see problem based assessment and plan for additional details. ? ?Medications: ?Apixaban 5 mg BID ?Famotidine 20 mg qd ?Oxycodone IR 5 mg q 8 hrs (following DVT/ PE admission) ? ?PMH: ?Provoked DVT/ PE in 2008 ? ?Surgical History: ?Hysterectomy ?Cholecystectomy ?Septoplasty ? ?Family History: ?No known history of blood clots in family, no known history of malignancy ? ?Social History: ?She lives in [place] with her husband.One of their sons lives in [place] and the other in [place]. 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 She smoked for about 10 years, 3-4 cigarettes a day and stopped in 2015. She drinks a few wine coolers each month. No other recreational drug use.  ? ?Review of Systems: ?ROS negative except for what is noted on the assessment and plan. ? ?Objective:  ? ?Vitals:  ? 05/08/21 0935  ?BP: 131/86  ?Pulse: 94  ?SpO2: 100%  ?Weight: 185 lb 14.4 oz (84.3 kg)  ?Height: 5\' 8"  (1.727 m)  ? ? ?Physical Exam: ?Gen: intermittently holding right leg while seated in wheelchair, in no acute distress ?Neck: no masses or nodules, AROM intact. ?CV: RRR, no murmurs, S1/S2 presents  ?Resp: Clear to auscultation bilaterally, normal work of breathing on room air  ?Abd: BS (+) x4, soft, non-tender abdomen, without hepatosplenomegaly or masses ?MSK: no erythema or swelling noted of right leg, pain with flexion and extension of right leg, Trace edema present in lower extremities bilaterally, gait is antalgic ?Skin: good skin turgor, no rashes, unusual bruising, or prominent lesions.  ?Neuro: No focal deficits, grossly  normal sensation and coordination.  ?Psych: Oriented x3 and responding appropriately. Intact memory, normal mood, judgement, affect, and insight.  ? ? ?Assessment & Plan:  ?Pulmonary embolism (HCC) ?[name] presents for hospital follow-up. She was admitted from 3/17 to 3/18. She come to the ED on 3/16 with right lower leg pain that had been present since surgery in December.  She states that the pain in her leg has gradually worsened. She started to feel short of breath and that is what brought her to the ED. Ultrasound showed DVT of right popliteal and femoral veins. CTA showed submassive PE with evidence of right heart strain. She was hemodynamically stable and PESI class stratified her as low risk.  Anticoagulation with eliquis was started. Echo with normal pulmonary artery systolic pressure. ? ?She is having pain in her right leg and constant pain in her chest that worsens with inspiration which has been present since presenting to hospital. Pain makes it difficult to sleep and she has been taking oxycodone given after discharge for pain relief. She reports allergy to tylenol and aleve. She is frustrated as she is an active person. On exam, she has pain with flexion and extension of right knee. Trace edema noted in legs bilaterally. Her gait is significantly antalgic and She is able to walk about 5 steps With assistance. ? ?Prior history of DVT in right leg with PE 15 years ago while on oral contraception. Less clear if current event is unprovoked or provoked, as she did have surgery, but presentation was  quite delayed from surgery. With history of 2 VTE, patient will need to be on anticoagulation for at least 6 months. I talked with patient about her concern over clots and her thoughts on taking anticoagulation life long. She states that she doesn't like taking medications, but she would do what she needed to do. Patient endorses completing starter pack dosing and starting 5 mg dosing of DOAC 3/24.   ?P: ?-Continue Eliquis for at least 6 months (September) ?-Work towards updating age appropriate cancer screening ?-referral for colonoscopy ?-mammogram ?-she reports cervical cancer screening in the past being negative. Unable to find this on chart review. Request records at follow-up. ?-f/u in 4 weeks ? ?Elevated blood pressure reading without diagnosis of hypertension ?BP Readings from Last 3 Encounters:  ?05/08/21 131/86  ?05/03/21 (!) 150/82  ?05/01/21 138/71  ?  ?Patient presents for hospital follow-up. She was noted to have elevated BP during admission. In clinic her BP is mildly elevated. She does not have a blood pressure cuff at home, but checks it at store on occasion. It is usually 120-130s/ 80s there. On chart review, BP has been mostly controlled with some elevations.  ?P: ?Recheck BP at follow-up visit 4 weeks ? ?Normocytic anemia ?Hemoglobin around 10.3-12 during admission. ?Iron studies within normal limits. Last colonoscopy in 2008 was normal at that time. ?-reticulocyte count ?-Folate ?-B12 ? ?ADDENDUM 05/13/21: ?Patient called.  Patient aware.  Vitamin B12 and folate within normal limits.  Reticulocyte reflective of Hypoproliferation. Differentials include hypothyroidism vs primary bone marrow disorders. Called and spoke with patient about meaning of results and why further evaluation is needed. She states that she is waiting until April when her new insurance will be active and plans to follow-up with a different PCP office at that time. ? ?ADDENDUM 3/31: ?CBC and TSH wnl, peripheral smear pending ? ? ?Colon cancer screening ?Last colonoscopy in 2008, normal at that time from chart review. ?-repeat colonoscopy ?  ? ?Patient discussed with Dr. Saverio Danker ? ? ?Christiana Fuchs, D.O. ?[facility]  PGY-1 ?Pager: [phone]  Phone: [phone] ?Date 05/16/2021  Time 5:04 PM  ? ? ?

## 2021-05-09 ENCOUNTER — Encounter: Payer: Self-pay | Admitting: Internal Medicine

## 2021-05-09 DIAGNOSIS — Z1211 Encounter for screening for malignant neoplasm of colon: Secondary | ICD-10-CM | POA: Insufficient documentation

## 2021-05-09 NOTE — Assessment & Plan Note (Signed)
BP Readings from Last 3 Encounters:  ?05/08/21 131/86  ?05/03/21 (!) 150/82  ?05/01/21 138/71  ?  ?Patient presents for hospital follow-up. She was noted to have elevated BP during admission. In clinic her BP is mildly elevated. She does not have a blood pressure cuff at home, but checks it at store on occasion. It is usually 120-130s/ 80s there. On chart review, BP has been mostly controlled with some elevations.  ?P: ?Recheck BP at follow-up visit 4 weeks ?

## 2021-05-09 NOTE — Assessment & Plan Note (Addendum)
Deborah Pratt presents for hospital follow-up. She was admitted from 3/17 to 3/18. She come to the ED on 3/16 with right lower leg pain that had been present since surgery in December.  She states that the pain in her leg has gradually worsened. She started to feel short of breath and that is what brought her to the ED. Ultrasound showed DVT of right popliteal and femoral veins. CTA showed submassive PE with evidence of right heart strain. She was hemodynamically stable and PESI class stratified her as low risk.  Anticoagulation with eliquis was started. Echo with normal pulmonary artery systolic pressure. ? ?She is having pain in her right leg and constant pain in her chest that worsens with inspiration which has been present since presenting to hospital. Pain makes it difficult to sleep and she has been taking oxycodone given after discharge for pain relief. She reports allergy to tylenol and aleve. She is frustrated as she is an active person. On exam, she has pain with flexion and extension of right knee. Trace edema noted in legs bilaterally. Her gait is significantly antalgic and She is able to walk about 5 steps With assistance. ? ?Prior history of DVT in right leg with PE 15 years ago while on oral contraception. Less clear if current event is unprovoked or provoked, as she did have surgery, but presentation was quite delayed from surgery. With history of 2 VTE, patient will need to be on anticoagulation for at least 6 months. I talked with patient about her concern over clots and her thoughts on taking anticoagulation life long. She states that she doesn't like taking medications, but she would do what she needed to do. Patient endorses completing starter pack dosing and starting 5 mg dosing of DOAC 3/24.  ?P: ?-Continue Eliquis for at least 6 months (September) ?-Work towards updating age appropriate cancer screening ?-referral for colonoscopy ?-mammogram ?-she reports cervical cancer screening in the  past being negative. Unable to find this on chart review. Request records at follow-up. ?-f/u in 4 weeks ?

## 2021-05-09 NOTE — Assessment & Plan Note (Addendum)
Hemoglobin around 10.3-12 during admission. ?Iron studies within normal limits. Last colonoscopy in 2008 was normal at that time. ?-reticulocyte count ?-Folate ?-B12 ? ?ADDENDUM 05/13/21: ?Patient called.  Patient aware.  Vitamin B12 and folate within normal limits.  Reticulocyte reflective of Hypoproliferation. Differentials include hypothyroidism vs primary bone marrow disorders. Called and spoke with patient about meaning of results and why further evaluation is needed. She states that she is waiting until April when her new insurance will be active and plans to follow-up with a different PCP office at that time. ? ?ADDENDUM 3/31: ?CBC and TSH wnl, peripheral smear pending ? ?

## 2021-05-09 NOTE — Assessment & Plan Note (Addendum)
Last colonoscopy in 2008, normal at that time from chart review. ?-repeat colonoscopy ?

## 2021-05-10 LAB — VITAMIN B12: Vitamin B-12: 318 pg/mL (ref 232–1245)

## 2021-05-10 LAB — FOLATE: Folate: 8.9 ng/mL (ref >3.0)

## 2021-05-10 LAB — RETICULOCYTES: Retic Ct Pct: 1.8 % (ref 0.6–2.6)

## 2021-05-12 NOTE — Progress Notes (Signed)
Internal Medicine Clinic Attending  Case discussed with Dr. Masters  At the time of the visit.  We reviewed the resident's history and exam and pertinent patient test results.  I agree with the assessment, diagnosis, and plan of care documented in the resident's note.  

## 2021-05-13 ENCOUNTER — Other Ambulatory Visit: Payer: Self-pay | Admitting: Internal Medicine

## 2021-05-13 DIAGNOSIS — D649 Anemia, unspecified: Secondary | ICD-10-CM

## 2021-05-14 ENCOUNTER — Telehealth: Payer: Self-pay

## 2021-05-14 NOTE — Addendum Note (Signed)
Addended by: Edwyna Perfect on: 05/14/2021 01:23 PM ? ? Modules accepted: Orders ? ?

## 2021-05-14 NOTE — Telephone Encounter (Signed)
Requesting to speak with Dr. Lennon Alstrom regarding lab results, please call back.  ?

## 2021-05-14 NOTE — Progress Notes (Signed)
Reviewed results with patient 3/28. She has a history of normocytic anemia (Hgb 11, MVC 93) and labwork was negative for low iron, folate or B12. Initially patient did not want to pursue further evaluation at [facility].  But after speaking with her husband, she decided to go ahead with blood work. ? ?Differentials include hypothyroidism (Last TSH in 2016 wnl), myelodysplastic disorder, or thalassemia. She does not have history of heart failure or CKD. ?P: ?-TSH, CBC, peripheral smear ?-will reach out to front desk staff to put patient on schedule for lab only visit 3/30 at 8:45AM. ?

## 2021-05-15 ENCOUNTER — Other Ambulatory Visit (INDEPENDENT_AMBULATORY_CARE_PROVIDER_SITE_OTHER): Payer: Commercial Managed Care - PPO

## 2021-05-15 DIAGNOSIS — D649 Anemia, unspecified: Secondary | ICD-10-CM

## 2021-05-15 LAB — CBC
HCT: 37.9 % (ref 36.0–46.0)
Hemoglobin: 12.1 g/dL (ref 12.0–15.0)
MCH: 30.6 pg (ref 26.0–34.0)
MCHC: 31.9 g/dL (ref 30.0–36.0)
MCV: 95.9 fL (ref 80.0–100.0)
Platelets: 297 10*3/uL (ref 150–400)
RBC: 3.95 MIL/uL (ref 3.87–5.11)
RDW: 12.6 % (ref 11.5–15.5)
WBC: 5.2 10*3/uL (ref 4.0–10.5)
nRBC: 0 % (ref 0.0–0.2)

## 2021-05-15 NOTE — Addendum Note (Signed)
Addended by: Edwyna Perfect on: 05/15/2021 09:13 AM ? ? Modules accepted: Orders ? ?

## 2021-05-16 LAB — TSH: TSH: 1.96 u[IU]/mL (ref 0.450–4.500)

## 2021-05-18 LAB — PATHOLOGIST SMEAR REVIEW

## 2021-05-25 ENCOUNTER — Other Ambulatory Visit: Payer: Self-pay | Admitting: Student

## 2021-05-27 ENCOUNTER — Ambulatory Visit
Admission: RE | Admit: 2021-05-27 | Discharge: 2021-05-27 | Disposition: A | Discharge status: Home / Self Care | Payer: Medicare HMO | Source: Ambulatory Visit | Attending: Registered Nurse | Admitting: Registered Nurse

## 2021-05-27 ENCOUNTER — Other Ambulatory Visit: Payer: Self-pay | Admitting: Registered Nurse

## 2021-05-27 DIAGNOSIS — R059 Cough, unspecified: Secondary | ICD-10-CM

## 2021-06-05 ENCOUNTER — Encounter: Payer: Commercial Managed Care - PPO | Admitting: Internal Medicine

## 2021-06-06 ENCOUNTER — Emergency Department (HOSPITAL_COMMUNITY): Payer: Commercial Managed Care - PPO

## 2021-06-06 ENCOUNTER — Encounter (HOSPITAL_COMMUNITY): Payer: Self-pay | Admitting: *Deleted

## 2021-06-06 ENCOUNTER — Emergency Department (HOSPITAL_COMMUNITY)
Admission: EM | Admit: 2021-06-06 | Discharge: 2021-06-06 | Disposition: A | Discharge status: Home / Self Care | Payer: Commercial Managed Care - PPO | Attending: Emergency Medicine | Admitting: Emergency Medicine

## 2021-06-06 ENCOUNTER — Other Ambulatory Visit: Payer: Self-pay

## 2021-06-06 DIAGNOSIS — S7000XA Contusion of unspecified hip, initial encounter: Secondary | ICD-10-CM | POA: Diagnosis not present

## 2021-06-06 DIAGNOSIS — S40012A Contusion of left shoulder, initial encounter: Secondary | ICD-10-CM | POA: Insufficient documentation

## 2021-06-06 DIAGNOSIS — W01198A Fall on same level from slipping, tripping and stumbling with subsequent striking against other object, initial encounter: Secondary | ICD-10-CM | POA: Insufficient documentation

## 2021-06-06 DIAGNOSIS — Z91040 Latex allergy status: Secondary | ICD-10-CM | POA: Diagnosis not present

## 2021-06-06 DIAGNOSIS — I10 Essential (primary) hypertension: Secondary | ICD-10-CM | POA: Insufficient documentation

## 2021-06-06 DIAGNOSIS — S0181XA Laceration without foreign body of other part of head, initial encounter: Secondary | ICD-10-CM | POA: Insufficient documentation

## 2021-06-06 DIAGNOSIS — Z7901 Long term (current) use of anticoagulants: Secondary | ICD-10-CM | POA: Insufficient documentation

## 2021-06-06 DIAGNOSIS — Z91010 Allergy to peanuts: Secondary | ICD-10-CM | POA: Diagnosis not present

## 2021-06-06 DIAGNOSIS — Y92480 Sidewalk as the place of occurrence of the external cause: Secondary | ICD-10-CM | POA: Insufficient documentation

## 2021-06-06 DIAGNOSIS — Y9301 Activity, walking, marching and hiking: Secondary | ICD-10-CM | POA: Diagnosis not present

## 2021-06-06 DIAGNOSIS — S0993XA Unspecified injury of face, initial encounter: Secondary | ICD-10-CM | POA: Diagnosis present

## 2021-06-06 DIAGNOSIS — W19XXXA Unspecified fall, initial encounter: Secondary | ICD-10-CM

## 2021-06-06 MED ORDER — OXYCODONE HCL 5 MG PO TABS
5.0000 mg | ORAL_TABLET | Freq: Once | ORAL | Status: AC
  Administered 2021-06-06: 5 mg via ORAL

## 2021-06-06 MED ORDER — LIDOCAINE-EPINEPHRINE (PF) 2 %-1:200000 IJ SOLN
10.0000 mL | Freq: Once | INTRAMUSCULAR | Status: DC
  Filled 2021-06-06: qty 20

## 2021-06-06 MED ORDER — LIDOCAINE-EPINEPHRINE-TETRACAINE (LET) TOPICAL GEL
3.0000 mL | Freq: Once | TOPICAL | Status: AC
Start: 2021-06-06 — End: 2021-06-06
  Administered 2021-06-06: 3 mL via TOPICAL
  Filled 2021-06-06: qty 3

## 2021-06-06 NOTE — ED Triage Notes (Addendum)
Pt fell this morning about 0530 hitting table wuth left shoulder and head, lip lac due to teeth going thorough upper lip. Dizziness noted, PT IS ON BLOOD THINNERS ?

## 2021-06-06 NOTE — Discharge Instructions (Addendum)
Thankfully today all the x-rays were normal and there is no sign of broken bones or internal bleeding.  Make sure you eat a soft diet over the next few days and you may have to keep your dentures out as your gums may be too sore.  You can take the pain medication you have at home as needed.  Also rinse your mouth out after eating.  The stitches need to be removed in 5 days.  You can shower and it is okay to get them wet just do not scrub directly on them. ?

## 2021-06-06 NOTE — ED Provider Notes (Signed)
?[facility]-EMERGENCY DEPT ?Provider Note ? ? ?CSN: [id number] ?Arrival date & time: 06/06/21  0932 ? ?  ? ?History ? ?Chief Complaint  ?Patient presents with  ? Fall  ? Laceration  ? ? ?Deborah Pratt is a 66 y.o. female. ? ?Patient is a 66 year old female with history of trauma after falling over rug. Dizziness. a history of asthma, PE currently on Eliquis, cholecystectomy in December, hypertension who presents today after a fall.  female with a history of asthma, PE currently on Eliquis, cholecystectomy in December, hypertension who presents today after a fall.  Patient reports she actually fell yesterday as well.  She was walking on a sidewalk yesterday and she noticed there was a yellow line painted on the sidewalk and she thought it was just a dip but it ended up being a step and she fell and landed on her left hip.  She was able to get up and walk and it was feeling sore this morning but she was otherwise okay.  This morning she was walking in her house and she caught the corner of one of her throw rugs and tripped falling forward hitting her face and mouth on either the corner of a chair or table and then falling to the floor.  She is not aware that she hit her head on anything else but did land on her left shoulder and she thinks right hip.  She was able to stand and walk but she is having significant pain in the shoulder.  She also noted her dentures went through her upper lip and she pulled them out.  She is having some soreness of her upper gums.  She denies any chest pain, shortness of breath or abdominal pain.  She has no severe neck pain.  She did report after getting here in standing up that she felt a little dizzy but denies feeling dizzy or off balance prior to the fall.  She denies any visual changes.  No nausea or vomiting. ? ?The history is provided by the patient.  ?Fall ? ?Laceration ? ?  ? ?Home Medications ?Prior to Admission medications   ?Medication Sig Start Date End Date Taking? Authorizing Provider  ?apixaban (ELIQUIS) 5 MG TABS tablet Take 2 tablets (10 mg total) by mouth 2 (two) times daily for 5 days, THEN 1 tablet (5 mg total) 2 (two)  times daily. 05/03/21 02/02/22  Steffanie Rainwater, MD  ?famotidine (PEPCID) 20 MG tablet Take 1 tablet (20 mg total) by mouth daily. 05/04/21   Steffanie Rainwater, MD  ?oxyCODONE (ROXICODONE) 5 MG immediate release tablet Take 1 tablet (5 mg total) by mouth every 8 (eight) hours as needed. 05/08/21 05/08/22  Masters, Florentina Addison, DO  ?   ? ?Allergies    ?Other, Peanut-containing drug products, Shellfish allergy, Acetaminophen, Demerol [meperidine], Fish oil, Latex, Morphine, Pork-derived products, and Tomato   ? ?Review of Systems   ?Review of Systems ? ?Physical Exam ?Updated Vital Signs ?BP (!) 143/102   Pulse 92   Temp 97.7 ?F (36.5 ?C) (Oral)   Resp 14   Ht 5\' 8"  (1.727 m)   Wt 83.9 kg   SpO2 98%   BMI 28.13 kg/m?  ?Physical Exam ?Vitals and nursing note reviewed.  ?Constitutional:   ?   General: She is not in acute distress. ?   Appearance: She is well-developed.  ?HENT:  ?   Head: Normocephalic and atraumatic.  ?   Mouth/Throat:  ?   Mouth: Mucous membranes are moist.  ? ?   Comments: Bruising noted to the upper gums.  Patient is a dentulous ?Eyes:  ?   Pupils: Pupils are equal,  round, and reactive to light.  ?Cardiovascular:  ?   Rate and Rhythm: Normal rate and regular rhythm.  ?   Heart sounds: Normal heart sounds. No murmur heard. ?  No friction rub.  ?Pulmonary:  ?   Effort: Pulmonary effort is normal.  ?   Breath sounds: Normal breath sounds. No wheezing or rales.  ?Abdominal:  ?   General: Bowel sounds are normal. There is no distension.  ?   Palpations: Abdomen is soft.  ?   Tenderness: There is no abdominal tenderness. There is no guarding or rebound.  ?Musculoskeletal:     ?   General: Tenderness and signs of injury present.  ?   Left shoulder: Swelling, tenderness and bony tenderness present.  ?     Arms: ? ?   Cervical back: Normal range of motion and neck supple. No tenderness. No spinous process tenderness or muscular tenderness.  ?   Right hip: Tenderness present. Normal range of motion.  ?    Left hip: Tenderness present. Normal range of motion.  ?   Comments: No edema.  Tenderness in bilateral hips with internal and external rotation of the hip and flexion.  No pain with axial loading on either leg.  Full range of motion passively is present.Left greater than right pain.  ?Skin: ?   General: Skin is warm and dry.  ?   Findings: No rash.  ?Neurological:  ?   Mental Status: She is alert and oriented to person, place, and time. Mental status is at baseline.  ?   Cranial Nerves: No cranial nerve deficit.  ?Psychiatric:     ?   Mood and Affect: Mood normal.     ?   Behavior: Behavior normal.  ? ? ?ED Results / Procedures / Treatments   ?Labs ?(all labs ordered are listed, but only abnormal results are displayed) ?Labs Reviewed - No data to display ? ?EKG ?None ? ?Radiology ?DG Pelvis 1-2 Views ? ?Result Date: 06/06/2021 ?CLINICAL DATA:  Bilateral hip pain, fall EXAM: PELVIS - 1 VIEW COMPARISON:  None. FINDINGS: There is no evidence of pelvic fracture or diastasis. No pelvic bone lesions are seen. IMPRESSION: Negative. Electronically Signed   By: Allegra LaiLeah  Strickland M.D.   On: 06/06/2021 08:45  ? ?CT Head Wo Contrast ? ?Result Date: 06/06/2021 ?CLINICAL DATA:  66 year old female with history of trauma after falling over rug. Dizziness. history of trauma after falling over rug. Dizziness. EXAM: CT HEAD WITHOUT CONTRAST CT MAXILLOFACIAL WITHOUT CONTRAST CT CERVICAL SPINE WITHOUT CONTRAST TECHNIQUE: Multidetector CT imaging of the head, cervical spine, and maxillofacial structures were performed using the standard protocol without intravenous contrast. Multiplanar CT image reconstructions of the cervical spine and maxillofacial structures were also generated. RADIATION DOSE REDUCTION: This exam was performed according to the departmental dose-optimization program which includes automated exposure control, adjustment of the mA and/or kV according to patient size and/or use of iterative reconstruction technique. COMPARISON:  CT the cervical spine 08/11/2013. FINDINGS: CT  HEAD FINDINGS Brain: No evidence of acute infarction, hemorrhage, hydrocephalus, extra-axial collection or mass lesion/mass effect. Vascular: No hyperdense vessel or unexpected calcification. Skull: Normal. Negative for fracture or focal lesion. Other: None. CT MAXILLOFACIAL FINDINGS Osseous: No fracture or mandibular dislocation. No destructive process. Orbits: Negative. No traumatic or inflammatory finding. Sinuses: Clear. Soft tissues: Negative. CT CERVICAL SPINE FINDINGS Alignment: Normal. Skull base and vertebrae: No acute fracture. No primary bone lesion or focal pathologic process. Soft tissues and spinal canal: No prevertebral fluid or swelling. No visible canal hematoma. Disc levels:  Multilevel degenerative disc disease, most severe at C3-C4, C4-C5, C5-C6 and C6-C7. Moderate multilevel facet arthropathy (left-greater-than-right). Upper chest: Unremarkable. Other: None. IMPRESSION: 1. No evidence of significant acute traumatic injury to the skull, facial bones, brain or spine. 2. The appearance of the brain is normal. 3. Multilevel degenerative disc disease and cervical spondylosis, as above. Electronically Signed   By: Trudie Reed M.D.   On: 06/06/2021 08:37  ? ?CT Cervical Spine Wo Contrast ? ?Result Date: 06/06/2021 ?CLINICAL DATA:  66 year old female with history of trauma after falling over rug. Dizziness. history of trauma after falling over rug. Dizziness. EXAM: CT HEAD WITHOUT CONTRAST CT MAXILLOFACIAL WITHOUT CONTRAST CT CERVICAL SPINE WITHOUT CONTRAST TECHNIQUE: Multidetector CT imaging of the head, cervical spine, and maxillofacial structures were performed using the standard protocol without intravenous contrast. Multiplanar CT image reconstructions of the cervical spine and maxillofacial structures were also generated. RADIATION DOSE REDUCTION: This exam was performed according to the departmental dose-optimization program which includes automated exposure control, adjustment of the mA and/or kV according to patient size and/or use of  iterative reconstruction technique. COMPARISON:  CT the cervical spine 08/11/2013. FINDINGS: CT HEAD FINDINGS Brain: No evidence of acute infarction, hemorrhage, hydrocephalus, extra-axial collection or mass lesion/ma

## 2021-06-06 NOTE — ED Notes (Signed)
Pt denies dizziness prior to fall, states she tripped on a rug. Husband reports she looked dazed after. Pt reports dizziness at rest currently, somewhat exacerbated by movement. Endorses HA. ?

## 2021-12-16 ENCOUNTER — Other Ambulatory Visit: Payer: Self-pay | Admitting: Registered Nurse

## 2021-12-17 ENCOUNTER — Other Ambulatory Visit: Payer: Self-pay | Admitting: Registered Nurse

## 2021-12-17 DIAGNOSIS — I824Y1 Acute embolism and thrombosis of unspecified deep veins of right proximal lower extremity: Secondary | ICD-10-CM

## 2021-12-17 DIAGNOSIS — I2699 Other pulmonary embolism without acute cor pulmonale: Secondary | ICD-10-CM

## 2021-12-18 ENCOUNTER — Ambulatory Visit
Admission: RE | Admit: 2021-12-18 | Discharge: 2021-12-18 | Disposition: A | Discharge status: Home / Self Care | Payer: Medicare HMO | Source: Ambulatory Visit | Attending: Registered Nurse | Admitting: Registered Nurse

## 2021-12-18 ENCOUNTER — Other Ambulatory Visit: Payer: Medicare HMO

## 2021-12-18 DIAGNOSIS — I824Y1 Acute embolism and thrombosis of unspecified deep veins of right proximal lower extremity: Secondary | ICD-10-CM

## 2023-08-16 DIAGNOSIS — M5416 Radiculopathy, lumbar region: Secondary | ICD-10-CM | POA: Insufficient documentation

## 2023-11-24 ENCOUNTER — Emergency Department (HOSPITAL_COMMUNITY)

## 2023-11-24 ENCOUNTER — Encounter: Payer: Self-pay | Admitting: Emergency Medicine

## 2023-11-24 ENCOUNTER — Encounter (HOSPITAL_COMMUNITY): Payer: Self-pay

## 2023-11-24 ENCOUNTER — Ambulatory Visit: Admission: EM | Admit: 2023-11-24 | Discharge: 2023-11-24 | Disposition: A | Discharge status: Home / Self Care

## 2023-11-24 ENCOUNTER — Observation Stay (HOSPITAL_COMMUNITY)
Admission: EM | Admit: 2023-11-24 | Discharge: 2023-11-25 | Disposition: A | Discharge status: Home / Self Care | Source: Ambulatory Visit | Attending: Internal Medicine | Admitting: Internal Medicine

## 2023-11-24 ENCOUNTER — Other Ambulatory Visit: Payer: Self-pay

## 2023-11-24 DIAGNOSIS — Z79899 Other long term (current) drug therapy: Secondary | ICD-10-CM | POA: Insufficient documentation

## 2023-11-24 DIAGNOSIS — J45909 Unspecified asthma, uncomplicated: Secondary | ICD-10-CM | POA: Diagnosis not present

## 2023-11-24 DIAGNOSIS — F419 Anxiety disorder, unspecified: Secondary | ICD-10-CM | POA: Insufficient documentation

## 2023-11-24 DIAGNOSIS — R6510 Systemic inflammatory response syndrome (SIRS) of non-infectious origin without acute organ dysfunction: Secondary | ICD-10-CM | POA: Diagnosis not present

## 2023-11-24 DIAGNOSIS — F411 Generalized anxiety disorder: Secondary | ICD-10-CM | POA: Diagnosis present

## 2023-11-24 DIAGNOSIS — Z91040 Latex allergy status: Secondary | ICD-10-CM | POA: Insufficient documentation

## 2023-11-24 DIAGNOSIS — F32A Depression, unspecified: Secondary | ICD-10-CM | POA: Diagnosis not present

## 2023-11-24 DIAGNOSIS — I10 Essential (primary) hypertension: Secondary | ICD-10-CM | POA: Insufficient documentation

## 2023-11-24 DIAGNOSIS — L0390 Cellulitis, unspecified: Secondary | ICD-10-CM | POA: Insufficient documentation

## 2023-11-24 DIAGNOSIS — E871 Hypo-osmolality and hyponatremia: Secondary | ICD-10-CM | POA: Insufficient documentation

## 2023-11-24 DIAGNOSIS — I2699 Other pulmonary embolism without acute cor pulmonale: Secondary | ICD-10-CM | POA: Insufficient documentation

## 2023-11-24 DIAGNOSIS — R2240 Localized swelling, mass and lump, unspecified lower limb: Secondary | ICD-10-CM | POA: Diagnosis present

## 2023-11-24 DIAGNOSIS — R509 Fever, unspecified: Secondary | ICD-10-CM | POA: Diagnosis not present

## 2023-11-24 DIAGNOSIS — L03116 Cellulitis of left lower limb: Principal | ICD-10-CM | POA: Diagnosis present

## 2023-11-24 DIAGNOSIS — A419 Sepsis, unspecified organism: Principal | ICD-10-CM | POA: Insufficient documentation

## 2023-11-24 DIAGNOSIS — E876 Hypokalemia: Secondary | ICD-10-CM | POA: Diagnosis not present

## 2023-11-24 DIAGNOSIS — F10920 Alcohol use, unspecified with intoxication, uncomplicated: Secondary | ICD-10-CM | POA: Diagnosis not present

## 2023-11-24 DIAGNOSIS — E785 Hyperlipidemia, unspecified: Secondary | ICD-10-CM | POA: Diagnosis present

## 2023-11-24 LAB — POC COVID19/FLU A&B COMBO
Covid Antigen, POC: NEGATIVE
Influenza A Antigen, POC: NEGATIVE
Influenza B Antigen, POC: NEGATIVE

## 2023-11-24 LAB — CBC WITH DIFFERENTIAL/PLATELET
Abs Immature Granulocytes: 0.03 K/uL (ref 0.00–0.07)
Basophils Absolute: 0 K/uL (ref 0.0–0.1)
Basophils Relative: 0 %
Eosinophils Absolute: 0 K/uL (ref 0.0–0.5)
Eosinophils Relative: 0 %
HCT: 36 % (ref 36.0–46.0)
Hemoglobin: 11.7 g/dL — ABNORMAL LOW (ref 12.0–15.0)
Immature Granulocytes: 0 %
Lymphocytes Relative: 13 %
Lymphs Abs: 1.2 K/uL (ref 0.7–4.0)
MCH: 30.2 pg (ref 26.0–34.0)
MCHC: 32.5 g/dL (ref 30.0–36.0)
MCV: 92.8 fL (ref 80.0–100.0)
Monocytes Absolute: 0.6 K/uL (ref 0.1–1.0)
Monocytes Relative: 7 %
Neutro Abs: 7.5 K/uL (ref 1.7–7.7)
Neutrophils Relative %: 80 %
Platelets: 172 K/uL (ref 150–400)
RBC: 3.88 MIL/uL (ref 3.87–5.11)
RDW: 13.2 % (ref 11.5–15.5)
WBC: 9.4 K/uL (ref 4.0–10.5)
nRBC: 0 % (ref 0.0–0.2)

## 2023-11-24 LAB — D-DIMER, QUANTITATIVE: D-Dimer, Quant: 0.28 ug{FEU}/mL (ref 0.00–0.50)

## 2023-11-24 LAB — COMPREHENSIVE METABOLIC PANEL WITH GFR
ALT: 40 U/L (ref 0–44)
AST: 39 U/L (ref 15–41)
Albumin: 4 g/dL (ref 3.5–5.0)
Alkaline Phosphatase: 149 U/L — ABNORMAL HIGH (ref 38–126)
Anion gap: 13 (ref 5–15)
BUN: 15 mg/dL (ref 8–23)
CO2: 20 mmol/L — ABNORMAL LOW (ref 22–32)
Calcium: 8.9 mg/dL (ref 8.9–10.3)
Chloride: 101 mmol/L (ref 98–111)
Creatinine, Ser: 1.06 mg/dL — ABNORMAL HIGH (ref 0.44–1.00)
GFR, Estimated: 57 mL/min — ABNORMAL LOW (ref >60)
Glucose, Bld: 108 mg/dL — ABNORMAL HIGH (ref 70–99)
Potassium: 3 mmol/L — ABNORMAL LOW (ref 3.5–5.1)
Sodium: 133 mmol/L — ABNORMAL LOW (ref 135–145)
Total Bilirubin: 0.7 mg/dL (ref 0.0–1.2)
Total Protein: 7.3 g/dL (ref 6.5–8.1)

## 2023-11-24 LAB — PROTIME-INR
INR: 1.1 (ref 0.8–1.2)
Prothrombin Time: 15.1 s (ref 11.4–15.2)

## 2023-11-24 LAB — I-STAT CG4 LACTIC ACID, ED: Lactic Acid, Venous: 1.4 mmol/L (ref 0.5–1.9)

## 2023-11-24 MED ORDER — POTASSIUM CHLORIDE 10 MEQ/100ML IV SOLN
10.0000 meq | Freq: Once | INTRAVENOUS | Status: AC
  Administered 2023-11-24: 10 meq via INTRAVENOUS
  Filled 2023-11-24: qty 100

## 2023-11-24 MED ORDER — POTASSIUM CHLORIDE CRYS ER 20 MEQ PO TBCR
40.0000 meq | EXTENDED_RELEASE_TABLET | Freq: Once | ORAL | Status: AC
  Administered 2023-11-24: 40 meq via ORAL
  Filled 2023-11-24: qty 2

## 2023-11-24 MED ORDER — VANCOMYCIN HCL IN DEXTROSE 1-5 GM/200ML-% IV SOLN
1000.0000 mg | Freq: Once | INTRAVENOUS | Status: AC
  Administered 2023-11-24: 1000 mg via INTRAVENOUS
  Filled 2023-11-24: qty 200

## 2023-11-24 MED ORDER — POTASSIUM CHLORIDE CRYS ER 20 MEQ PO TBCR
40.0000 meq | EXTENDED_RELEASE_TABLET | ORAL | Status: AC
  Administered 2023-11-24 (×2): 40 meq via ORAL
  Filled 2023-11-24 (×2): qty 2

## 2023-11-24 MED ORDER — IBUPROFEN 800 MG PO TABS
800.0000 mg | ORAL_TABLET | Freq: Once | ORAL | Status: AC
  Administered 2023-11-24: 800 mg via ORAL

## 2023-11-24 MED ORDER — LACTATED RINGERS IV BOLUS (SEPSIS)
1000.0000 mL | Freq: Once | INTRAVENOUS | Status: AC
  Administered 2023-11-24: 1000 mL via INTRAVENOUS

## 2023-11-24 MED ORDER — ACETAMINOPHEN 500 MG PO TABS
1000.0000 mg | ORAL_TABLET | Freq: Once | ORAL | Status: AC
  Administered 2023-11-24: 1000 mg via ORAL
  Filled 2023-11-24: qty 2

## 2023-11-24 MED ORDER — SODIUM CHLORIDE 0.9 % IV SOLN
2.0000 g | Freq: Once | INTRAVENOUS | Status: AC
  Administered 2023-11-24: 2 g via INTRAVENOUS
  Filled 2023-11-24: qty 20

## 2023-11-24 NOTE — ED Notes (Signed)
 Patient had complained about pain returning to leg, noted she had been sitting up with legs dangling at bedside.  Patient reminded to keep legs elevated on bed to help relieve swelling/pain.  Patient verbalized understanding.  Patient is ambulatory - restricted to bathroom only.  Family members are at bedside with patient.

## 2023-11-24 NOTE — ED Provider Notes (Signed)
 [facility]    CSN: [id number] Arrival date & time: 11/24/23  0818      History   Chief Complaint Chief Complaint  Patient presents with   Foot Pain    HPI Deborah Pratt is a 68 y.o. female.   Discussed the use of AI scribe software for clinical note transcription with the patient, who gave verbal consent to proceed.   The patient presents with swelling, pain, redness, and warmth of the left foot that began yesterday morning. The patient reports feeling generally unwell since onset, describing fatigue and lack of energy upon waking yesterday. At that time, the left foot was noted to be painful, red, and warm, with swelling that has slightly worsened since. The patient endorses intermittent numbness and tingling in the left foot but remains able to ambulate. Associated symptoms include nausea, chills, and a mild headache. The patient denies subjective fever, chest pain, shortness of breath, or recent respiratory illness. There is no history of recent injury, fall, or trauma to the affected foot. Past medical history is notable for a right leg blood clot in 2023, currently managed with Eliquis .  The following sections of the patient's history were reviewed and updated as appropriate: allergies, current medications, past family history, past medical history, past social history, past surgical history, and problem list.     Past Medical History:  Diagnosis Date   Acute deep vein thrombosis (DVT) of right lower extremity (HCC)    Allergy    ANXIETY 11/11/2006   Qualifier: Diagnosis of  By: Norleen MD, [place] ORN    Asthma    Cholecystitis with cholelithiasis 02/13/2021   CHOLELITHIASIS 11/08/2006   Qualifier: Diagnosis of  By: Helga Almarie Caldron    Cholelithiasis with cholecystitis 11/08/2006   Qualifier: Diagnosis of  By: Helga Almarie Caldron    DEPRESSION 11/11/2006   Qualifier: Diagnosis of  By: Norleen MD, [place] ORN    Generalized headaches     Hyperlipidemia 10/03/2014   HYPERTENSION 11/08/2006   Qualifier: Diagnosis of  By: Helga Almarie Caldron    Kidney stones    Pulmonary embolism (HCC) 68 yo    on BCP   UTI (lower urinary tract infection)     Patient Active Problem List   Diagnosis Date Noted   Lumbar radiculopathy 08/16/2023   Colon cancer screening 05/09/2021   Normocytic anemia 05/02/2021   Elevated blood pressure reading without diagnosis of hypertension 05/02/2021   Right knee pain 05/01/2021   Hyperlipidemia 10/03/2014   Daytime somnolence 07/26/2014   Snoring 03/03/2012   Allergic rhinitis    Asthma    Pulmonary embolism (HCC)    Anxiety state 11/11/2006   Depression 11/11/2006    Past Surgical History:  Procedure Laterality Date   COLONOSCOPY     LAPAROSCOPIC ASSISTED VAGINAL HYSTERECTOMY     LAPAROSCOPIC CHOLECYSTECTOMY SINGLE SITE WITH INTRAOPERATIVE CHOLANGIOGRAM N/A 02/13/2021   Procedure: LAPAROSCOPIC CHOLECYSTECTOMY SINGLE SITE WITH INTRAOPERATIVE CHOLANGIOGRAM;  Surgeon: Sheldon Standing, MD;  Location: [facility];  Service: General;  Laterality: N/A;   NASAL SINUS SURGERY     TUBAL LIGATION      OB History   No obstetric history on file.      Home Medications    Prior to Admission medications   Medication Sig Start Date End Date Taking? Authorizing Provider  amLODipine (NORVASC) 5 MG tablet Take 5 mg by mouth. 11/16/23  Yes [provider]  apixaban  (ELIQUIS ) 2.5 MG TABS tablet Take 2.5 mg by mouth.  Yes [provider]  metoprolol tartrate (LOPRESSOR) 50 MG tablet Take 50 mg by mouth. 10/25/23  Yes [provider]  pantoprazole (PROTONIX) 40 MG tablet Take 40 mg by mouth. 10/25/23  Yes [provider]  apixaban  (ELIQUIS ) 5 MG TABS tablet Take 2 tablets (10 mg total) by mouth 2 (two) times daily for 5 days, THEN 1 tablet (5 mg total) 2 (two) times daily. 05/03/21 02/02/22  Amponsah, Prosper M, MD  cyclobenzaprine (FLEXERIL) 5 MG tablet Take 5 mg by  mouth. Patient not taking: Reported on 11/24/2023    [provider]  famotidine  (PEPCID ) 20 MG tablet Take 1 tablet (20 mg total) by mouth daily. Patient not taking: Reported on 11/24/2023 05/04/21   Amponsah, Prosper M, MD  ofloxacin (OCUFLOX) 0.3 % ophthalmic solution 2 drops. Patient not taking: Reported on 11/24/2023    [provider]    Family History Family History  Problem Relation Age of Onset   Heart disease Mother    Gout Father    Arthritis Other    Arthritis Other    Heart disease Other    Stroke Other    Hypertension Other     Social History Social History   Tobacco Use   Smoking status: Former    Current packs/day: 0.00    Types: Cigarettes    Quit date: 02/15/2014    Years since quitting: 9.7   Smokeless tobacco: Never  Vaping Use   Vaping status: Never Used  Substance Use Topics   Alcohol use: Yes    Comment: occasionally   Drug use: No     Allergies   Other, Peanut-containing drug products, Shellfish allergy, Pecan extract, Walnut, Acetaminophen , Demerol [meperidine], Fish oil, Latex, Morphine, Pork-derived products, and Tomato   Review of Systems Review of Systems  Constitutional:  Positive for activity change and fatigue. Negative for chills and fever.  HENT: Negative.    Respiratory:  Negative for cough and shortness of breath.   Cardiovascular:  Negative for chest pain.  Gastrointestinal:  Positive for nausea. Negative for vomiting.  Musculoskeletal:  Positive for arthralgias (left foot pain with swelling and redness). Negative for gait problem.  Neurological:  Positive for weakness (generalized), numbness (and tingling of the left foot) and headaches (mild). Negative for dizziness.  All other systems reviewed and are negative.    Physical Exam Triage Vital Signs ED Triage Vitals [11/24/23 0852]  Encounter Vitals Group     BP (!) 145/82     Girls Systolic BP Percentile      Girls Diastolic BP Percentile      Boys  Systolic BP Percentile      Boys Diastolic BP Percentile      Pulse Rate (!) 113     Resp 18     Temp (!) 100.6 F (38.1 C)     Temp Source Oral     SpO2 96 %     Weight      Height      Head Circumference      Peak Flow      Pain Score 7     Pain Loc      Pain Education      Exclude from Growth Chart    No data found.  Updated Vital Signs BP (!) 145/82 (BP Location: Left Arm)   Pulse (!) 113   Temp (!) 100.6 F (38.1 C) (Oral)   Resp 18   SpO2 96%   Visual Acuity Right Eye Distance:  Left Eye Distance:   Bilateral Distance:    Right Eye Near:   Left Eye Near:    Bilateral Near:     Physical Exam Vitals reviewed.  Constitutional:      General: She is awake. She is not in acute distress.    Appearance: Normal appearance. She is well-developed. She is not toxic-appearing or diaphoretic.     Comments: Acutely-ill appearing with rigors, no acute distress   HENT:     Head: Normocephalic.     Right Ear: Hearing normal.     Left Ear: Hearing normal.     Nose: Nose normal.     Mouth/Throat:     Mouth: Mucous membranes are moist.  Eyes:     General: Vision grossly intact.     Conjunctiva/sclera: Conjunctivae normal.  Cardiovascular:     Rate and Rhythm: Regular rhythm. Tachycardia present.     Pulses:          Dorsalis pedis pulses are 2+ on the left side.     Heart sounds: Normal heart sounds.  Pulmonary:     Effort: Pulmonary effort is normal.     Breath sounds: Normal breath sounds and air entry.  Musculoskeletal:        General: Normal range of motion.     Cervical back: Normal range of motion and neck supple.     Right ankle: Normal.     Left ankle: Normal.     Right foot: Normal.     Left foot: Normal range of motion and normal capillary refill. Swelling and tenderness present. No deformity, bony tenderness or crepitus. Normal pulse.  Feet:     Left foot:     Skin integrity: Skin integrity normal.     Comments: Swelling, erythema, and warmth noted  over the dorsal aspect of the left foot. Strength, sensation, and range of motion intact. Skin integrity preserved with no ulcerations or breakdown. Neurovascularly intact. Skin:    General: Skin is warm and dry.  Neurological:     General: No focal deficit present.     Mental Status: She is alert and oriented to person, place, and time.     Cranial Nerves: No cranial nerve deficit.     Sensory: Sensation is intact. No sensory deficit.     Motor: Motor function is intact.     Coordination: Coordination is intact.     Gait: Gait is intact.  Psychiatric:        Mood and Affect: Mood and affect normal.        Speech: Speech normal.        Behavior: Behavior is cooperative.      UC Treatments / Results  Labs (all labs ordered are listed, but only abnormal results are displayed) Labs Reviewed  POC COVID19/FLU A&B COMBO - Normal    EKG   Radiology DG Foot Complete Left Result Date: 11/24/2023 CLINICAL DATA:  Left foot swelling, fever EXAM: LEFT FOOT - COMPLETE 3+ VIEW COMPARISON:  None Available. FINDINGS: There is no evidence of fracture or dislocation. There is no evidence of arthropathy or other focal bone abnormality. No osseous erosions or lytic or blastic osseous lesions. Soft tissue swelling about the forefoot. IMPRESSION: Soft tissue swelling with no acute osseous abnormalities identified. Electronically Signed   By: Michaeline Blanch M.D.   On: 11/24/2023 11:44   DG Chest Port 1 View Result Date: 11/24/2023 CLINICAL DATA:  Left foot swelling since yesterday. Chills, fever. History of lower extremity DVT.  EXAM: PORTABLE CHEST 1 VIEW COMPARISON:  05/27/2021. FINDINGS: Trachea is midline. Heart size normal. Lungs are clear. No pleural fluid. IMPRESSION: No acute findings. Electronically Signed   By: Newell Eke M.D.   On: 11/24/2023 11:43    Procedures Procedures (including critical care time)  Medications Ordered in UC Medications  ibuprofen  (ADVIL ) tablet 800 mg (800 mg  Oral Given 11/24/23 1005)    Initial Impression / Assessment and Plan / UC Course  I have reviewed the triage vital signs and the nursing notes.  Pertinent labs & imaging results that were available during my care of the patient were reviewed by me and considered in my medical decision making (see chart for details).     The patient presents with acute onset of left foot swelling, redness, warmth, and pain accompanied by systemic symptoms including chills, malaise, and low-grade fever. Exam findings are consistent with cellulitis. Patient is febrile and tachycardic, meeting SIRS criteria with a suspected infectious source. Ibuprofen  800 mg PO was administered in clinic. Given concern for possible sepsis, the patient was referred to the emergency department for further evaluation and management. She remains clinically stable for transport and will proceed to the ED via private vehicle, accompanied by her husband.  The patient presents with symptoms and exam findings that warrant a higher level of care and further evaluation. Given the clinical picture, emergency department (ED) assessment is recommended for advanced diagnostics and management. The patient is currently stable and appropriate for transport via private vehicle. A responsible adult will be driving. The patient is alert, oriented, demonstrates clear mental status, good insight into their illness, and sound judgment in making decisions regarding their care. The risks, rationale, and need for ED evaluation were discussed, and the patient is agreeable to the plan.  Documentation was completed with the aid of voice recognition software. Transcription may contain typographical errors. Final Clinical Impressions(s) / UC Diagnoses   Final diagnoses:  Fever, unspecified  SIRS (systemic inflammatory response syndrome) (HCC)  Cellulitis of left foot   Discharge Instructions   None    ED Prescriptions   None    PDMP not reviewed this  encounter.   Iola Lukes, [place] 11/24/23 [phone]

## 2023-11-24 NOTE — ED Triage Notes (Addendum)
 Pt reports swelling to R foot that started yesterday and has significantly gotten worse over the last 24hrs. R foot is warm to touch with redness and swelling. Pt reports top of her foot has a severe aching pain. No radiation of pain. No injuries or trauma to area. Only changes to routine are new medications that started on 10/02: metoprolol 50mg , amlodipine 5mg , and pantoprazole 40mg . Husband reports she was in bed all day yesterday with chills. Low grade fever in triage  - 100.6. Took nyquil last night to help with sleep no other meds for symptoms. Hx of DVT and PE.

## 2023-11-24 NOTE — H&P (Signed)
 History and Physical    Patient: Deborah Pratt DOB: Dec 05, 1955 DOA: 11/24/2023 DOS: the patient was seen and examined on 11/24/2023 PCP: Rik Glinda DASEN, FNP  Patient coming from: Home  Chief Complaint: Fever, redness/swelling left lower extremity Chief Complaint  Patient presents with   Foot Swelling   HPI: Deborah Pratt is a 68 y.o. female with medical history significant of HTN, Hx PE/DVT on Eliquis , GERD who presented to [facility] ED on 11/24/2023 with progressive erythema, edema and pain to left lower extremity x 1 day.  Husband present at bedside and assists with HPI.  Also endorses fever/chills at home.  Reports skin defect between left great toe and second toe and into her web.  Denies any recent falls, no trauma, no recent travel or changes in her medications.  Reports compliance with her Eliquis .  In the ED, temperature 100.6 F, HR 115, RR 23, BP 137/72, SpO2 95% on room air.  WBC 9.4, hemoglobin 11.7, platelet count 172.  Sodium 133, potassium 3.0, chloride 101, CO2 20, glucose 108, BUN 15, creat 1.06.  AST 39, ALT 40, total bilirubin 0.7.  Lactic acid 1.4.  D-dimer 0.28.  INR 1.1.  COVID/influenza PCR negative.  Chest x-ray with no acute findings.  Left foot x-ray with soft tissue swelling and no acute osseous abnormalities.  Blood cultures x 2 obtained.  Patient given LR 1 L bolus followed by infusion, vancomycin and ceftriaxone .  TRH consulted for admission for further evaluation management of sepsis secondary to left lower extremity cellulitis.   Review of Systems: As mentioned in the history of present illness. All other systems reviewed and are negative. Past Medical History:  Diagnosis Date   Acute deep vein thrombosis (DVT) of right lower extremity (HCC)    Allergy    ANXIETY 11/11/2006   Qualifier: Diagnosis of  By: Norleen MD, [place] ORN    Asthma    Cholecystitis with cholelithiasis 02/13/2021   CHOLELITHIASIS 11/08/2006   Qualifier: Diagnosis  of  By: Helga Almarie Caldron    Cholelithiasis with cholecystitis 11/08/2006   Qualifier: Diagnosis of  By: Helga Almarie Caldron    DEPRESSION 11/11/2006   Qualifier: Diagnosis of  By: Norleen MD, [place] ORN    Generalized headaches    Hyperlipidemia 10/03/2014   HYPERTENSION 11/08/2006   Qualifier: Diagnosis of  By: Helga Almarie Caldron    Kidney stones    Pulmonary embolism (HCC) 68 yo    on BCP   UTI (lower urinary tract infection)    Past Surgical History:  Procedure Laterality Date   COLONOSCOPY     LAPAROSCOPIC ASSISTED VAGINAL HYSTERECTOMY     LAPAROSCOPIC CHOLECYSTECTOMY SINGLE SITE WITH INTRAOPERATIVE CHOLANGIOGRAM N/A 02/13/2021   Procedure: LAPAROSCOPIC CHOLECYSTECTOMY SINGLE SITE WITH INTRAOPERATIVE CHOLANGIOGRAM;  Surgeon: Sheldon Standing, MD;  Location: [facility];  Service: General;  Laterality: N/A;   NASAL SINUS SURGERY     TUBAL LIGATION     Social History:  reports that she quit smoking about 9 years ago. Her smoking use included cigarettes. She has never used smokeless tobacco. She reports current alcohol use. She reports that she does not use drugs.  Allergies  Allergen Reactions   Other Anaphylaxis    *Potatoes*    Peanut-Containing Drug Products Hives   Shellfish Allergy Hives   Pecan Extract     Other Reaction(s): Not available   Walnut     Other Reaction(s): Not available   Acetaminophen  Rash    Other Reaction(s): Not available  Demerol [Meperidine] Hives   Fish Oil Hives   Latex Rash and Dermatitis   Morphine Rash    Other Reaction(s): Not available   Pork-Derived Products Hives   Tomato Hives    Family History  Problem Relation Age of Onset   Heart disease Mother    Gout Father    Arthritis Other    Arthritis Other    Heart disease Other    Stroke Other    Hypertension Other     Prior to Admission medications   Medication Sig Start Date End Date Taking? Authorizing Provider  amLODipine (NORVASC) 5 MG tablet Take 5 mg by mouth. 11/16/23   Yes [provider]  apixaban  (ELIQUIS ) 2.5 MG TABS tablet Take 2.5 mg by mouth 2 (two) times daily.   Yes [provider]  metoprolol tartrate (LOPRESSOR) 50 MG tablet Take 50 mg by mouth. 10/25/23  Yes [provider]  pantoprazole (PROTONIX) 40 MG tablet Take 40 mg by mouth. 10/25/23  Yes [provider]    Physical Exam: Vitals:   11/24/23 1039 11/24/23 1040 11/24/23 1500 11/24/23 1745  BP: 137/72  120/68 128/71  Pulse: (!) 115  81 89  Resp: 20  (!) 23 18  Temp: 98.5 F (36.9 C)  97.8 F (36.6 C)   TempSrc: Oral  Oral   SpO2: 95%  91% 95%  Weight:  86.2 kg    Height:  5' 8 (1.727 m)     Physical Exam GEN: 68 yo female in NAD, alert and oriented x 3, wd/wn HEENT: NCAT, PERRL, EOMI, sclera clear, MMM PULM: CTAB w/o wheezes/crackles, normal respiratory effort, on room air CV: RRR w/o M/G/R GI: abd soft, NTND, + BS MSK: + LLE peripheral edema, moves all extremities independently, + TTP on calf squeeze NEURO: CN II-XII intact, no focal deficits, sensation to light touch intact PSYCH: normal mood/affect Integumentary: Left lower extremity with erythema noted to dorsal surface extending to mid calf, + skin defect noted between left great toe and 2nd toe interweb space.  No other concerning rashes/lesions/wounds noted on exposed skin surfaces.   Assessment and Plan:  Sepsis secondary to left lower extremity cellulitis Patient presenting with fever, chills, erythema and edema extending from dorsal surface of left foot to mid calf anterior surface.  Does report skin defect between left great toe and second toe.  Fever 100.6 F, tachycardic on admission.  D-dimer normal.  Reports no missing doses of Eliquis . -- Check vascular duplex ultrasound left lower extremity to rule out recurrent DVT -- Cefazolin 2 g IV every 8 hours -- RN wound consult for skin breakdown left great/second toe inter web space -- Monitor fever curve  Hyponatremia Sodium 133,  suspect hypovolemic hyponatremia setting of dehydration. -- Continue IV fluids with NS at 75 mL/h -- BMP in a.m.  Hypokalemia Potassium 3.0, will replete. --Repeat electrolytes in a.m. to go magnesium  History of PE/DVT -- Continue Eliquis  2.5 mg p.o. twice daily  HTN BP 120/68 -- Hold home amlodipine and metoprolol for now -- Continue monitor BP closely    Advance Care Planning:   Code Status: Full Code   Consults: None  Family Communication: Husband present at bedside  Severity of Illness: The appropriate patient status for this patient is INPATIENT. Inpatient status is judged to be reasonable and necessary in order to provide the required intensity of service to ensure the patient's safety. The patient's presenting symptoms, physical exam findings, and initial radiographic and laboratory data in the  context of their chronic comorbidities is felt to place them at high risk for further clinical deterioration. Furthermore, it is not anticipated that the patient will be medically stable for discharge from the hospital within 2 midnights of admission.   * I certify that at the point of admission it is my clinical judgment that the patient will require inpatient hospital care spanning beyond 2 midnights from the point of admission due to high intensity of service, high risk for further deterioration and high frequency of surveillance required.*  Author: Camellia PARAS [place], DO 11/24/2023 6:43 PM  For on call review [email or web address].

## 2023-11-24 NOTE — ED Notes (Signed)
 Allergic to tylenol . Unable to use standing order for fever in office and pain.

## 2023-11-24 NOTE — ED Notes (Signed)
 Patient was difficult IV start causing delay.  Multiple attempts.

## 2023-11-24 NOTE — ED Triage Notes (Addendum)
 Patient has had a swollen left foot since yesterday with the swelling moving up to her left knee. Sent from urgent care. Now has chills, fever, red and warm to touch. Denies stepping on anything. History of blood clot in leg before.  Took 800mg  ibuprofen  before coming.

## 2023-11-24 NOTE — ED Notes (Signed)
 Patient is being discharged from the Urgent Care and sent to the Emergency Department via POV . Per Iola, NP, patient is in need of higher level of care due to SIRS criteria with current symptoms. Patient is aware and verbalizes understanding of plan of care. Declined EMS transport Vitals:   11/24/23 0852  BP: (!) 145/82  Pulse: (!) 113  Resp: 18  Temp: (!) 100.6 F (38.1 C)  SpO2: 96%

## 2023-11-24 NOTE — ED Notes (Signed)
 Patient asking when she is going upstairs to a room. Informed patient that there were no rooms in the hospital open at this time and that she would remain in the ED until a bed came available. JRPRN

## 2023-11-24 NOTE — Sepsis Progress Note (Addendum)
 Elink will follow per sepsis protocol.

## 2023-11-24 NOTE — ED Provider Notes (Signed)
 [place] EMERGENCY DEPARTMENT AT [facility] Provider Note   CSN: [id number] Arrival date & time: 11/24/23  8966     Patient presents with: Foot Swelling   Deborah Pratt is a 68 y.o. female.   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  Pt noticed some redness and pain to her right foot yesterday.  She said the swelling moved up to her knee this am.  She went to UC and was given ibuprofen  800 mg and was sent here for further eval.  Temp 100.6 at UC.       Prior to Admission medications   Medication Sig Start Date End Date Taking? Authorizing Provider  amLODipine (NORVASC) 5 MG tablet Take 5 mg by mouth. 11/16/23   [provider]  apixaban  (ELIQUIS ) 2.5 MG TABS tablet Take 2.5 mg by mouth.    [provider]  apixaban  (ELIQUIS ) 5 MG TABS tablet Take 2 tablets (10 mg total) by mouth 2 (two) times daily for 5 days, THEN 1 tablet (5 mg total) 2 (two) times daily. 05/03/21 02/02/22  Amponsah, Prosper M, MD  cyclobenzaprine (FLEXERIL) 5 MG tablet Take 5 mg by mouth. Patient not taking: Reported on 11/24/2023    [provider]  famotidine  (PEPCID ) 20 MG tablet Take 1 tablet (20 mg total) by mouth daily. Patient not taking: Reported on 11/24/2023 05/04/21   Amponsah, Prosper M, MD  metoprolol tartrate (LOPRESSOR) 50 MG tablet Take 50 mg by mouth. 10/25/23   [provider]  ofloxacin (OCUFLOX) 0.3 % ophthalmic solution 2 drops. Patient not taking: Reported on 11/24/2023    [provider]  pantoprazole (PROTONIX) 40 MG tablet Take 40 mg by mouth. 10/25/23   [provider]    Allergies: Other, Peanut-containing drug products, Shellfish allergy, Pecan extract, Walnut, Acetaminophen , Demerol [meperidine], Fish oil, Latex, Morphine, Pork-derived products, and Tomato    Review of Systems  Constitutional:  Positive for chills and fever.  Musculoskeletal:         Left leg pain and redness  All other systems reviewed and are negative.   Updated Vital Signs BP 137/72   Pulse (!) 115   Temp 98.5 F (36.9 C) (Oral)   Resp 20   Ht 5' 8 (1.727 m)   Wt 86.2 kg   SpO2 95%   BMI 28.89 kg/m   Physical Exam Vitals and nursing note reviewed.  Constitutional:      Appearance: Normal appearance.  HENT:     Head: Normocephalic and atraumatic.     Right Ear: External ear normal.     Left Ear: External ear normal.     Nose: Nose normal.     Mouth/Throat:     Mouth: Mucous membranes are moist.     Pharynx: Oropharynx is clear.  Eyes:     Extraocular Movements: Extraocular movements intact.     Conjunctiva/sclera: Conjunctivae normal.     Pupils: Pupils are equal, round, and reactive to light.  Cardiovascular:     Rate and Rhythm: Regular rhythm. Tachycardia present.     Pulses: Normal pulses.     Heart sounds: Normal heart sounds.  Pulmonary:     Effort: Pulmonary effort is normal.     Breath sounds: Normal breath sounds.  Abdominal:     General: Abdomen is flat. Bowel sounds are normal.     Palpations: Abdomen is soft.  Musculoskeletal:  General: Normal range of motion.     Cervical back: Normal range of motion and neck supple.  Skin:    General: Skin is warm.     Capillary Refill: Capillary refill takes less than 2 seconds.     Comments: Left foot redness with redness extending up to left knee.  See pictures.  Neurological:     General: No focal deficit present.     Mental Status: She is alert and oriented to person, place, and time.  Psychiatric:        Mood and Affect: Mood normal.        Behavior: Behavior normal.        (all labs ordered are listed, but only abnormal results are displayed) Labs Reviewed  COMPREHENSIVE METABOLIC PANEL WITH GFR - Abnormal; Notable for the following components:      Result Value   Sodium 133 (*)    Potassium 3.0 (*)    CO2 20 (*)    Glucose, Bld 108 (*)    Creatinine, Ser 1.06 (*)     Alkaline Phosphatase 149 (*)    GFR, Estimated 57 (*)    All other components within normal limits  CBC WITH DIFFERENTIAL/PLATELET - Abnormal; Notable for the following components:   Hemoglobin 11.7 (*)    All other components within normal limits  CULTURE, BLOOD (ROUTINE X 2)  CULTURE, BLOOD (ROUTINE X 2)  MRSA NEXT GEN BY PCR, NASAL  PROTIME-INR  D-DIMER, QUANTITATIVE  MAGNESIUM  I-STAT CG4 LACTIC ACID, ED  I-STAT CG4 LACTIC ACID, ED    EKG: None  Radiology: DG Foot Complete Left Result Date: 11/24/2023 CLINICAL DATA:  Left foot swelling, fever EXAM: LEFT FOOT - COMPLETE 3+ VIEW COMPARISON:  None Available. FINDINGS: There is no evidence of fracture or dislocation. There is no evidence of arthropathy or other focal bone abnormality. No osseous erosions or lytic or blastic osseous lesions. Soft tissue swelling about the forefoot. IMPRESSION: Soft tissue swelling with no acute osseous abnormalities identified. Electronically Signed   By: Michaeline Blanch M.D.   On: 11/24/2023 11:44   DG Chest Port 1 View Result Date: 11/24/2023 CLINICAL DATA:  Left foot swelling since yesterday. Chills, fever. History of lower extremity DVT. EXAM: PORTABLE CHEST 1 VIEW COMPARISON:  05/27/2021. FINDINGS: Trachea is midline. Heart size normal. Lungs are clear. No pleural fluid. IMPRESSION: No acute findings. Electronically Signed   By: Newell Eke M.D.   On: 11/24/2023 11:43     Procedures   Medications Ordered in the ED  vancomycin (VANCOCIN) IVPB 1000 mg/200 mL premix (1,000 mg Intravenous New Bag/Given 11/24/23 1242)  potassium chloride  10 mEq in 100 mL IVPB (10 mEq Intravenous New Bag/Given 11/24/23 1321)  lactated ringers  bolus 1,000 mL (1,000 mLs Intravenous New Bag/Given 11/24/23 1221)  cefTRIAXone  (ROCEPHIN ) 2 g in sodium chloride  0.9 % 100 mL IVPB (0 g Intravenous Stopped 11/24/23 1314)  acetaminophen  (TYLENOL ) tablet 1,000 mg (1,000 mg Oral Given 11/24/23 1227)  potassium chloride  SA  (KLOR-CON  M) CR tablet 40 mEq (40 mEq Oral Given 11/24/23 1318)                                    Medical Decision Making Amount and/or Complexity of Data Reviewed Labs: ordered. Radiology: ordered.  Risk OTC drugs. Prescription drug management. Decision regarding hospitalization.   This patient presents to the ED for concern of cellulitis, this involves an extensive  number of treatment options, and is a complaint that carries with it a high risk of complications and morbidity.  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  The pertinent results include:  cbc with hgb 11.7 (stable); cmp with k low at 3; cr 1.06 (cr 0.84 in 2023); ddimer neg   Imaging Studies ordered:  I ordered imaging studies including left foot, cxr  I independently visualized and interpreted imaging which showed  L foot: Soft tissue swelling with no acute osseous abnormalities identified.  CXR: No acute findings.  I agree with the radiologist interpretation   Cardiac Monitoring:  The patient was maintained on a cardiac monitor.  I personally viewed and interpreted the cardiac monitored which showed an underlying rhythm of: nsr   Medicines ordered and prescription drug management:  I ordered medication including rocephin /vancomycin/ivfs  for sx  Reevaluation of the patient after these medicines showed that the patient improved I have reviewed the patients home medicines and have made adjustments as needed   Test Considered:  mri   Critical Interventions:  Iv abx   Consultations Obtained:  I requested consultation with the hospitalist (Dr.  [place]),  and discussed lab and imaging findings as well as pertinent plan - he will admit   Problem List / ED Course:  LLE cellulitis:  maybe early sepsis; code sepsis called and iv abx given and ivfs given Hypokalemia:  iv and oral k given   Reevaluation:  After the interventions noted above, I reevaluated the patient and found that they have :improved   Social Determinants of Health:  Lives at home   Dispostion:  After consideration of the diagnostic results and the patients response to treatment, I feel that the patent would benefit from admission.    CRITICAL CARE Performed by: Mliss Boyers   Total critical care time: 30 minutes  Critical care time was exclusive of separately billable procedures and treating other patients.  Critical care was necessary to treat or prevent imminent or life-threatening deterioration.  Critical care was time spent personally by me on the following activities: development of treatment plan with patient and/or surrogate as well as nursing, discussions with consultants, evaluation of patient's response to treatment, examination of patient, obtaining history from patient or surrogate, ordering and performing treatments and interventions, ordering and review of laboratory studies, ordering and review of radiographic studies, pulse oximetry and re-evaluation of patient's condition.      Final diagnoses:  Cellulitis of left lower extremity  Hypokalemia    ED Discharge Orders     None          Boyers Mliss, MD 11/24/23 1329

## 2023-11-25 ENCOUNTER — Inpatient Hospital Stay (HOSPITAL_BASED_OUTPATIENT_CLINIC_OR_DEPARTMENT_OTHER)

## 2023-11-25 DIAGNOSIS — R609 Edema, unspecified: Secondary | ICD-10-CM | POA: Diagnosis not present

## 2023-11-25 DIAGNOSIS — A419 Sepsis, unspecified organism: Secondary | ICD-10-CM | POA: Diagnosis not present

## 2023-11-25 DIAGNOSIS — L0390 Cellulitis, unspecified: Secondary | ICD-10-CM | POA: Diagnosis not present

## 2023-11-25 DIAGNOSIS — L03116 Cellulitis of left lower limb: Principal | ICD-10-CM | POA: Diagnosis present

## 2023-11-25 LAB — BASIC METABOLIC PANEL WITH GFR
Anion gap: 12 (ref 5–15)
BUN: 11 mg/dL (ref 8–23)
CO2: 18 mmol/L — ABNORMAL LOW (ref 22–32)
Calcium: 9 mg/dL (ref 8.9–10.3)
Chloride: 104 mmol/L (ref 98–111)
Creatinine, Ser: 0.9 mg/dL (ref 0.44–1.00)
GFR, Estimated: >60 mL/min (ref >60)
Glucose, Bld: 102 mg/dL — ABNORMAL HIGH (ref 70–99)
Potassium: 4.4 mmol/L (ref 3.5–5.1)
Sodium: 133 mmol/L — ABNORMAL LOW (ref 135–145)

## 2023-11-25 LAB — CBC
HCT: 35.7 % — ABNORMAL LOW (ref 36.0–46.0)
Hemoglobin: 11.9 g/dL — ABNORMAL LOW (ref 12.0–15.0)
MCH: 30.7 pg (ref 26.0–34.0)
MCHC: 33.3 g/dL (ref 30.0–36.0)
MCV: 92.2 fL (ref 80.0–100.0)
Platelets: 178 K/uL (ref 150–400)
RBC: 3.87 MIL/uL (ref 3.87–5.11)
RDW: 13.2 % (ref 11.5–15.5)
WBC: 7.2 K/uL (ref 4.0–10.5)
nRBC: 0 % (ref 0.0–0.2)

## 2023-11-25 LAB — MAGNESIUM: Magnesium: 2.1 mg/dL (ref 1.7–2.4)

## 2023-11-25 MED ORDER — OXYCODONE HCL 5 MG PO TABS: 5.0000 mg | ORAL_TABLET | ORAL | Status: DC | PRN

## 2023-11-25 MED ORDER — ONDANSETRON HCL 4 MG/2ML IJ SOLN: 4.0000 mg | Freq: Four times a day (QID) | INTRAMUSCULAR | Status: DC | PRN

## 2023-11-25 MED ORDER — CEFAZOLIN SODIUM-DEXTROSE 2-4 GM/100ML-% IV SOLN
2.0000 g | Freq: Three times a day (TID) | INTRAVENOUS | Status: DC
  Administered 2023-11-25: 2 g via INTRAVENOUS
  Filled 2023-11-25 (×2): qty 100

## 2023-11-25 MED ORDER — APIXABAN 2.5 MG PO TABS
2.5000 mg | ORAL_TABLET | Freq: Two times a day (BID) | ORAL | Status: DC
Start: 2023-11-25 — End: 2023-11-25
  Administered 2023-11-25: 2.5 mg via ORAL
  Filled 2023-11-25: qty 1

## 2023-11-25 MED ORDER — ONDANSETRON HCL 4 MG PO TABS: 4.0000 mg | ORAL_TABLET | Freq: Four times a day (QID) | ORAL | Status: DC | PRN

## 2023-11-25 MED ORDER — PANTOPRAZOLE SODIUM 40 MG PO TBEC: 40.0000 mg | DELAYED_RELEASE_TABLET | Freq: Every day | ORAL | Status: DC

## 2023-11-25 MED ORDER — POLYETHYLENE GLYCOL 3350 17 G PO PACK: 17.0000 g | PACK | Freq: Every day | ORAL | Status: DC | PRN

## 2023-11-25 MED ORDER — CEPHALEXIN 500 MG PO CAPS: 500.0000 mg | ORAL_CAPSULE | Freq: Four times a day (QID) | ORAL | 0 refills | Status: AC

## 2023-11-25 MED ORDER — IBUPROFEN 200 MG PO TABS: 400.0000 mg | ORAL_TABLET | Freq: Four times a day (QID) | ORAL | Status: DC | PRN

## 2023-11-25 MED ORDER — ALBUTEROL SULFATE (2.5 MG/3ML) 0.083% IN NEBU: 2.5000 mg | INHALATION_SOLUTION | RESPIRATORY_TRACT | Status: DC | PRN

## 2023-11-25 NOTE — Discharge Summary (Signed)
 Physician Discharge Summary  Deborah Pratt FMW:[id number] DOB: 1955/10/26 DOA: 11/24/2023  PCP: Rik Glinda DASEN, FNP  Admit date: 11/24/2023 Discharge date: 11/25/2023  Admitted From: Home Disposition: Home  Recommendations for Outpatient Follow-up:  Follow up with PCP in 1-2 weeks Continue Keflex 500 mg p.o. 4 times daily x 10 days for left lower extremity cellulitis Apply Xeroform gauze to left anterior foot and between great and second toes Q day, then cover with ABD pad and kerlex   Home Health: No Equipment/Devices: None  Discharge Condition: Stable CODE STATUS: Full code Diet recommendation: Heart healthy diet  History of present illness:  Deborah Pratt is a 68 y.o. female with medical history significant of HTN, Hx PE/DVT on Eliquis , GERD who presented to [facility] ED on 11/24/2023 with progressive erythema, edema and pain to left lower extremity x 1 day.  Husband present at bedside and assists with HPI.  Also endorses fever/chills at home.  Reports skin defect between left great toe and second toe and into her web.  Denies any recent falls, no trauma, no recent travel or changes in her medications.  Reports compliance with her Eliquis .   In the ED, temperature 100.6 F, HR 115, RR 23, BP 137/72, SpO2 95% on room air.  WBC 9.4, hemoglobin 11.7, platelet count 172.  Sodium 133, potassium 3.0, chloride 101, CO2 20, glucose 108, BUN 15, creat 1.06.  AST 39, ALT 40, total bilirubin 0.7.  Lactic acid 1.4.  D-dimer 0.28.  INR 1.1.  COVID/influenza PCR negative.  Chest x-ray with no acute findings.  Left foot x-ray with soft tissue swelling and no acute osseous abnormalities.  Blood cultures x 2 obtained.  Patient given LR 1 L bolus followed by infusion, vancomycin and ceftriaxone .  TRH consulted for admission for further evaluation management of sepsis secondary to left lower extremity cellulitis.  Hospital course:  Sepsis secondary to left lower extremity  cellulitis Patient presenting with fever, chills, erythema and edema extending from dorsal surface of left foot to mid calf anterior surface.  Does report skin defect between left great toe and second toe.  Fever 100.6 F, tachycardic on admission.  D-dimer normal.  Reports no missing doses of Eliquis .  Patient was started on IV cefazolin.  Patient has been afebrile since admission and will discharge on Keflex 500 mg p.o. 4 times daily to complete 10-day course.  Seen by wound RN with recommendations Apply Xeroform gauze to left anterior foot and between great and second toes Q day, then cover with ABD pad and kerlex.  Outpatient follow-up PCP 1 week.   Hyponatremia Sodium 133, suspect hypovolemic hyponatremia setting of dehydration.  Continue to encourage increased oral intake.   Hypokalemia Repleted.  Potassium 4.4 at time of discharge.   History of PE/DVT Continue Eliquis  2.5 mg p.o. twice daily   HTN Continue amlodipine and metoprolol.    Discharge Diagnoses:  Principal Problem:   Sepsis due to cellulitis [facility]) Active Problems:   Hyperlipidemia   Anxiety state   Depression   HTN (hypertension)   Asthma   Pulmonary embolism [facility])    Discharge Instructions  Discharge Instructions     Call MD for:  difficulty breathing, headache or visual disturbances   Complete by: As directed    Call MD for:  extreme fatigue   Complete by: As directed    Call MD for:  persistant dizziness or light-headedness   Complete by: As directed    Call MD for:  persistant nausea and  vomiting   Complete by: As directed    Call MD for:  redness, tenderness, or signs of infection (pain, swelling, redness, odor or green/yellow discharge around incision site)   Complete by: As directed    Call MD for:  temperature >100.4   Complete by: As directed    Diet - low sodium heart healthy   Complete by: As directed    Discharge wound care:   Complete by: As directed    Apply Xeroform gauze to anterior  foot and between great and second toes Q day, then cover with ABD pad and kerlex   Increase activity slowly   Complete by: As directed       Allergies as of 11/25/2023       Reactions   Other Anaphylaxis   *Potatoes*    Peanut-containing Drug Products Hives   Shellfish Allergy Hives   Pecan Extract    Other Reaction(s): Not available   Walnut    Other Reaction(s): Not available   Acetaminophen  Rash   Other Reaction(s): Not available   Demerol [meperidine] Hives   Fish Oil Hives   Latex Rash, Dermatitis   Morphine Rash   Other Reaction(s): Not available   Pork-derived Products Hives   Tomato Hives        Medication List     TAKE these medications    amLODipine 5 MG tablet Commonly known as: NORVASC Take 5 mg by mouth.   apixaban  2.5 MG Tabs tablet Commonly known as: ELIQUIS  Take 2.5 mg by mouth 2 (two) times daily.   cephALEXin 500 MG capsule Commonly known as: KEFLEX Take 1 capsule (500 mg total) by mouth 4 (four) times daily for 10 days.   metoprolol tartrate 50 MG tablet Commonly known as: LOPRESSOR Take 50 mg by mouth.   pantoprazole 40 MG tablet Commonly known as: PROTONIX Take 40 mg by mouth.               Discharge Care Instructions  (From admission, onward)           Start     Ordered   11/25/23 0000  Discharge wound care:       Comments: Apply Xeroform gauze to anterior foot and between great and second toes Q day, then cover with ABD pad and kerlex   11/25/23 0816            Follow-up Information     Rik Glinda DASEN, FNP. Schedule an appointment as soon as possible for a visit in 1 week(s).   Specialty: Family Medicine Contact information: [address] [place] [address] [phone]                Allergies  Allergen Reactions   Other Anaphylaxis    *Potatoes*    Peanut-Containing Drug Products Hives   Shellfish Allergy Hives   Pecan Extract     Other Reaction(s): Not available   Walnut      Other Reaction(s): Not available   Acetaminophen  Rash    Other Reaction(s): Not available   Demerol [Meperidine] Hives   Fish Oil Hives   Latex Rash and Dermatitis   Morphine Rash    Other Reaction(s): Not available   Pork-Derived Products Hives   Tomato Hives    Consultations: none   Procedures/Studies: DG Foot Complete Left Result Date: 11/24/2023 CLINICAL DATA:  Left foot swelling, fever EXAM: LEFT FOOT - COMPLETE 3+ VIEW COMPARISON:  None Available. FINDINGS: There is no evidence of fracture  or dislocation. There is no evidence of arthropathy or other focal bone abnormality. No osseous erosions or lytic or blastic osseous lesions. Soft tissue swelling about the forefoot. IMPRESSION: Soft tissue swelling with no acute osseous abnormalities identified. Electronically Signed   By: Michaeline Blanch M.D.   On: 11/24/2023 11:44   DG Chest Port 1 View Result Date: 11/24/2023 CLINICAL DATA:  Left foot swelling since yesterday. Chills, fever. History of lower extremity DVT. EXAM: PORTABLE CHEST 1 VIEW COMPARISON:  05/27/2021. FINDINGS: Trachea is midline. Heart size normal. Lungs are clear. No pleural fluid. IMPRESSION: No acute findings. Electronically Signed   By: Newell Eke M.D.   On: 11/24/2023 11:43     Subjective: Patient seen examined bedside, remains in ED holding area.  Spouse present at bedside.  No complaints this morning and wishes to discharge home.  Has been afebrile since admission.  No other questions or concerns at this time. Patient denies headache, no dizziness, no chest pain, no palpitations, no shortness of breath, no abdominal pain, no fever/chills/night sweats, no nausea/vomiting/diarrhea, no focal weakness, no fatigue, no paresthesias.  No acute events overnight per nursing staff.  Patient instructed on return indications.  Recommend seeing PCP in 7-10 days.  Discharge Exam: Vitals:   11/25/23 0540 11/25/23 0700  BP:  (!) 142/76  Pulse:  97  Resp:  (!) 24  Temp:  98.7 F (37.1 C) 98.8 F (37.1 C)  SpO2:  94%   Vitals:   11/25/23 0300 11/25/23 0400 11/25/23 0540 11/25/23 0700  BP: (!) 143/71 (!) 136/91  (!) 142/76  Pulse: 92 99  97  Resp:  (!) 30  (!) 24  Temp:   98.7 F (37.1 C) 98.8 F (37.1 C)  TempSrc:   Oral Oral  SpO2: 97% 96%  94%  Weight:      Height:        Physical Exam: GEN: 68 yo female in NAD, alert and oriented x 3, wd/wn HEENT: NCAT, PERRL, EOMI, sclera clear, MMM PULM: CTAB w/o wheezes/crackles, normal respiratory effort, on room air CV: RRR w/o M/G/R GI: abd soft, NTND, + BS MSK: + LLE peripheral edema, moves all extremities independently, + TTP on calf squeeze NEURO: CN II-XII intact, no focal deficits, sensation to light touch intact PSYCH: normal mood/affect Integumentary: Left lower extremity with erythema noted to dorsal surface extending to mid calf, + skin defect noted between left great toe and 2nd toe interweb space.  No other concerning rashes/lesions/wounds noted on exposed skin surfaces.       The results of significant diagnostics from this hospitalization (including imaging, microbiology, ancillary and laboratory) are listed below for reference.     Microbiology: Recent Results (from the past 240 hours)  Blood Culture (routine x 2)     Status: None (Preliminary result)   Collection Time: 11/24/23 11:33 AM   Specimen: BLOOD RIGHT ARM  Result Value Ref Range Status   Specimen Description   Final    BLOOD RIGHT ARM Performed at [facility], [address] [place], [place] [address]    Special Requests   Final    BOTTLES DRAWN AEROBIC AND ANAEROBIC Blood Culture adequate volume Performed at [facility], [address] [place], [place] [address]    Culture PENDING  Incomplete   Report Status PENDING  Incomplete  Blood Culture (routine x 2)     Status: None (Preliminary result)   Collection Time: 11/24/23 11:33 AM   Specimen: BLOOD LEFT  ARM  Result Value Ref Range  Status   Specimen Description   Final    BLOOD LEFT ARM Performed at [facility], [address] [place], [place] [address]    Special Requests   Final    BOTTLES DRAWN AEROBIC AND ANAEROBIC Blood Culture adequate volume Performed at [facility], [address] [place], [place] [address]    Culture PENDING  Incomplete   Report Status PENDING  Incomplete     Labs: BNP (last 3 results) No results for input(s): BNP in the last 8760 hours. Basic Metabolic Panel: Recent Labs  Lab 11/24/23 1133 11/25/23 0540  NA 133* 133*  K 3.0* 4.4  CL 101 104  CO2 20* 18*  GLUCOSE 108* 102*  BUN 15 11  CREATININE 1.06* 0.90  CALCIUM  8.9 9.0  MG  --  2.1   Liver Function Tests: Recent Labs  Lab 11/24/23 1133  AST 39  ALT 40  ALKPHOS 149*  BILITOT 0.7  PROT 7.3  ALBUMIN 4.0   No results for input(s): LIPASE, AMYLASE in the last 168 hours. No results for input(s): AMMONIA in the last 168 hours. CBC: Recent Labs  Lab 11/24/23 1133 11/25/23 0540  WBC 9.4 7.2  NEUTROABS 7.5  --   HGB 11.7* 11.9*  HCT 36.0 35.7*  MCV 92.8 92.2  PLT 172 178   Cardiac Enzymes: No results for input(s): CKTOTAL, CKMB, CKMBINDEX, TROPONINI in the last 168 hours. BNP: Invalid input(s): POCBNP CBG: No results for input(s): GLUCAP in the last 168 hours. D-Dimer Recent Labs    11/24/23 1133  DDIMER 0.28   Hgb A1c No results for input(s): HGBA1C in the last 72 hours. Lipid Profile No results for input(s): CHOL, HDL, LDLCALC, TRIG, CHOLHDL, LDLDIRECT in the last 72 hours. Thyroid  function studies No results for input(s): TSH, T4TOTAL, T3FREE, THYROIDAB in the last 72 hours.  Invalid input(s): FREET3 Anemia work up No results for input(s): VITAMINB12, FOLATE, FERRITIN, TIBC, IRON, RETICCTPCT in the last 72 hours. Urinalysis    Component Value Date/Time   COLORURINE YELLOW 02/12/2021 2202   APPEARANCEUR HAZY  (A) 02/12/2021 2202   LABSPEC 1.011 02/12/2021 2202   PHURINE 6.0 02/12/2021 2202   GLUCOSEU NEGATIVE 02/12/2021 2202   GLUCOSEU NEGATIVE 10/03/2014 0944   HGBUR SMALL (A) 02/12/2021 2202   BILIRUBINUR NEGATIVE 02/12/2021 2202   BILIRUBINUR negative 09/14/2015 0938   KETONESUR NEGATIVE 02/12/2021 2202   PROTEINUR NEGATIVE 02/12/2021 2202   UROBILINOGEN 0.2 09/14/2015 0938   UROBILINOGEN 0.2 10/03/2014 0944   NITRITE NEGATIVE 02/12/2021 2202   LEUKOCYTESUR NEGATIVE 02/12/2021 2202   Sepsis Labs Recent Labs  Lab 11/24/23 1133 11/25/23 0540  WBC 9.4 7.2   Microbiology Recent Results (from the past 240 hours)  Blood Culture (routine x 2)     Status: None (Preliminary result)   Collection Time: 11/24/23 11:33 AM   Specimen: BLOOD RIGHT ARM  Result Value Ref Range Status   Specimen Description   Final    BLOOD RIGHT ARM Performed at [facility], [address], [place], [place] [address]    Special Requests   Final    BOTTLES DRAWN AEROBIC AND ANAEROBIC Blood Culture adequate volume Performed at [facility], [address], [place], [place] [address]    Culture PENDING  Incomplete   Report Status PENDING  Incomplete  Blood Culture (routine x 2)     Status: None (Preliminary result)   Collection Time: 11/24/23 11:33  AM   Specimen: BLOOD LEFT ARM  Result Value Ref Range Status   Specimen Description   Final    BLOOD LEFT ARM Performed at [facility], [address], [place], [place] [address]    Special Requests   Final    BOTTLES DRAWN AEROBIC AND ANAEROBIC Blood Culture adequate volume Performed at [facility], [address], [place], [place] [address]    Culture PENDING  Incomplete   Report Status PENDING  Incomplete     Time coordinating discharge: Over 30 minutes  SIGNED:   Camellia PARAS [place], DO  [facility] 11/25/2023, 8:17 AM

## 2023-11-25 NOTE — Consult Note (Addendum)
 WOC Nurse Consult Note: Reason for Consult: Consult requested for left foot. Performed remotely after review of progress notes and photos in the EMR.  Left foot with cellulitis; generalized edema and erythremia.  Pt is on systemic antibiotics.  Swelling has a created full thickness wound in between great toe and 2nd toe with moist macerated skin.  Topical treatment orders provided for bedside nurses to perform as follows to promote drying and healing: Apply Xeroform gauze to anterior foot and between great and second toes Q day, then cover with ABD pad and kerlex.  Please re-consult if further assistance is needed.   Thank-you,  Stephane Fought MSN, RN, CWOCN, CWCN-AP, CNS Contact Mon-Fri 0700-1500: [phone]

## 2023-11-25 NOTE — ED Notes (Signed)
 Pt's husband went to the car to grab the pt's Blood thinner, and take her night time does because she was worried about missing a does. Staff was informed of this after the pt took the medication.

## 2023-11-25 NOTE — Progress Notes (Signed)
 VASCULAR LAB    Left lower extremity venous duplex has been performed.  See CV proc for preliminary results.   Makarios Madlock, RVT 11/25/2023, 9:46 AM

## 2023-11-25 NOTE — Care Management CC44 (Signed)
 Condition Code 44 Documentation Completed  Patient Details  Name: ATHENA BALTZ MRN: [id number] Date of Birth: December 10, 1955   Condition Code 44 given:  Yes Patient signature on Condition Code 44 notice:  Yes Documentation of 2 MD's agreement:  Yes Code 44 added to claim:  Yes    Kari JONETTA Daisy, LCSW 11/25/2023, 9:20 AM

## 2023-11-25 NOTE — Care Management Obs Status (Signed)
 MEDICARE OBSERVATION STATUS NOTIFICATION   Patient Details  Name: Deborah Pratt MRN: [id number] Date of Birth: 1955/08/18   Medicare Observation Status Notification Given:  Yes    Kari JONETTA Daisy, LCSW 11/25/2023, 9:20 AM

## 2023-11-26 LAB — HIV ANTIBODY (ROUTINE TESTING W REFLEX): HIV Screen 4th Generation wRfx: NONREACTIVE

## 2023-11-29 LAB — CULTURE, BLOOD (ROUTINE X 2)
Culture: NO GROWTH
Culture: NO GROWTH
Special Requests: ADEQUATE
Special Requests: ADEQUATE
